# Patient Record
Sex: Male | Born: 1955 | Race: White | Hispanic: No | Marital: Married | Smoking: Never smoker
Health system: Southern US, Community
[De-identification: ages and names within clinical notes are randomized; demographics above are authoritative.]

## PROBLEM LIST (undated history)

## (undated) DIAGNOSIS — E039 Hypothyroidism, unspecified: Secondary | ICD-10-CM

## (undated) DIAGNOSIS — R972 Elevated prostate specific antigen [PSA]: Secondary | ICD-10-CM

## (undated) DIAGNOSIS — E785 Hyperlipidemia, unspecified: Secondary | ICD-10-CM

## (undated) DIAGNOSIS — R748 Abnormal levels of other serum enzymes: Secondary | ICD-10-CM

## (undated) DIAGNOSIS — E559 Vitamin D deficiency, unspecified: Secondary | ICD-10-CM

## (undated) DIAGNOSIS — I10 Essential (primary) hypertension: Secondary | ICD-10-CM

## (undated) HISTORY — DX: Abnormal levels of other serum enzymes: R74.8

## (undated) HISTORY — DX: Hypothyroidism, unspecified: E03.9

## (undated) HISTORY — DX: Hyperlipidemia, unspecified: E78.5

## (undated) HISTORY — DX: Elevated prostate specific antigen (PSA): R97.20

## (undated) HISTORY — DX: Vitamin D deficiency, unspecified: E55.9

---

## 2001-08-06 ENCOUNTER — Encounter: Admission: RE | Admit: 2001-08-06 | Discharge: 2001-08-06 | Payer: Self-pay | Admitting: Internal Medicine

## 2001-08-06 ENCOUNTER — Encounter: Payer: Self-pay | Admitting: Internal Medicine

## 2003-02-16 ENCOUNTER — Encounter: Admission: RE | Admit: 2003-02-16 | Discharge: 2003-02-16 | Payer: Self-pay | Admitting: Internal Medicine

## 2011-02-02 ENCOUNTER — Other Ambulatory Visit: Payer: Self-pay | Admitting: Family Medicine

## 2011-02-02 DIAGNOSIS — E039 Hypothyroidism, unspecified: Secondary | ICD-10-CM

## 2011-02-03 ENCOUNTER — Ambulatory Visit
Admission: RE | Admit: 2011-02-03 | Discharge: 2011-02-03 | Disposition: A | Discharge status: Home / Self Care | Payer: 59 | Source: Ambulatory Visit | Attending: Family Medicine | Admitting: Family Medicine

## 2011-02-03 DIAGNOSIS — E039 Hypothyroidism, unspecified: Secondary | ICD-10-CM

## 2011-06-08 ENCOUNTER — Ambulatory Visit
Admission: RE | Admit: 2011-06-08 | Discharge: 2011-06-08 | Disposition: A | Discharge status: Home / Self Care | Payer: Managed Care, Other (non HMO) | Source: Ambulatory Visit | Attending: Emergency Medicine | Admitting: Emergency Medicine

## 2011-06-08 ENCOUNTER — Other Ambulatory Visit: Payer: Self-pay | Admitting: Emergency Medicine

## 2011-06-08 DIAGNOSIS — M25539 Pain in unspecified wrist: Secondary | ICD-10-CM

## 2011-06-14 ENCOUNTER — Other Ambulatory Visit: Payer: Self-pay | Admitting: Emergency Medicine

## 2011-06-14 DIAGNOSIS — M25532 Pain in left wrist: Secondary | ICD-10-CM

## 2011-06-14 DIAGNOSIS — M25531 Pain in right wrist: Secondary | ICD-10-CM

## 2011-06-16 ENCOUNTER — Ambulatory Visit
Admission: RE | Admit: 2011-06-16 | Discharge: 2011-06-16 | Disposition: A | Discharge status: Home / Self Care | Payer: Managed Care, Other (non HMO) | Source: Ambulatory Visit | Attending: Emergency Medicine | Admitting: Emergency Medicine

## 2011-06-16 DIAGNOSIS — M25531 Pain in right wrist: Secondary | ICD-10-CM

## 2012-03-14 ENCOUNTER — Other Ambulatory Visit: Payer: Self-pay | Admitting: Endocrinology

## 2012-03-14 DIAGNOSIS — E039 Hypothyroidism, unspecified: Secondary | ICD-10-CM

## 2012-03-26 ENCOUNTER — Ambulatory Visit
Admission: RE | Admit: 2012-03-26 | Discharge: 2012-03-26 | Disposition: A | Discharge status: Home / Self Care | Payer: Managed Care, Other (non HMO) | Source: Ambulatory Visit | Attending: Endocrinology | Admitting: Endocrinology

## 2012-03-26 DIAGNOSIS — E039 Hypothyroidism, unspecified: Secondary | ICD-10-CM

## 2014-01-19 ENCOUNTER — Other Ambulatory Visit: Payer: Self-pay

## 2014-01-19 ENCOUNTER — Other Ambulatory Visit: Payer: Self-pay | Admitting: Family Medicine

## 2014-01-19 DIAGNOSIS — R945 Abnormal results of liver function studies: Secondary | ICD-10-CM

## 2014-02-02 ENCOUNTER — Ambulatory Visit
Admission: RE | Admit: 2014-02-02 | Discharge: 2014-02-02 | Disposition: A | Discharge status: Home / Self Care | Payer: Managed Care, Other (non HMO) | Source: Ambulatory Visit | Attending: Family Medicine | Admitting: Family Medicine

## 2014-02-02 DIAGNOSIS — R945 Abnormal results of liver function studies: Secondary | ICD-10-CM

## 2014-08-31 ENCOUNTER — Telehealth: Payer: Self-pay | Admitting: Hematology and Oncology

## 2014-08-31 NOTE — Telephone Encounter (Signed)
NEW PATIENT REFERRAL-S/W AMY @ DR. [facility] OFFICE AND GVE NP APPT FOR 07/28 @ 3:15 W/DR. [place] DX-HEMOCHROMATOSIS

## 2014-09-22 ENCOUNTER — Telehealth: Payer: Self-pay | Admitting: Hematology and Oncology

## 2014-09-22 NOTE — Telephone Encounter (Signed)
pt called to r/s appt...done...pt needed HIM new pt....transferred him to HIM

## 2014-10-01 ENCOUNTER — Ambulatory Visit: Payer: Managed Care, Other (non HMO) | Admitting: Hematology and Oncology

## 2014-10-01 ENCOUNTER — Ambulatory Visit: Payer: Managed Care, Other (non HMO)

## 2015-02-09 ENCOUNTER — Other Ambulatory Visit: Payer: Self-pay | Admitting: Emergency Medicine

## 2015-02-09 DIAGNOSIS — M25531 Pain in right wrist: Secondary | ICD-10-CM

## 2015-02-20 ENCOUNTER — Ambulatory Visit
Admission: RE | Admit: 2015-02-20 | Discharge: 2015-02-20 | Disposition: A | Discharge status: Home / Self Care | Payer: Managed Care, Other (non HMO) | Source: Ambulatory Visit | Attending: Emergency Medicine | Admitting: Emergency Medicine

## 2015-02-20 DIAGNOSIS — M25531 Pain in right wrist: Secondary | ICD-10-CM

## 2015-11-25 ENCOUNTER — Telehealth: Payer: Self-pay | Admitting: Hematology

## 2015-11-25 ENCOUNTER — Encounter: Payer: Self-pay | Admitting: Hematology

## 2015-11-25 NOTE — Telephone Encounter (Signed)
Pt confirmed appt, completed intake, mailed pt letter, faxed referring provider appt date/time

## 2015-12-21 ENCOUNTER — Telehealth: Payer: Self-pay | Admitting: Hematology

## 2015-12-21 ENCOUNTER — Encounter: Payer: Self-pay | Admitting: Hematology

## 2015-12-21 ENCOUNTER — Ambulatory Visit (HOSPITAL_BASED_OUTPATIENT_CLINIC_OR_DEPARTMENT_OTHER): Payer: Managed Care, Other (non HMO) | Admitting: Hematology

## 2015-12-21 ENCOUNTER — Other Ambulatory Visit (HOSPITAL_BASED_OUTPATIENT_CLINIC_OR_DEPARTMENT_OTHER): Payer: Managed Care, Other (non HMO)

## 2015-12-21 ENCOUNTER — Other Ambulatory Visit: Payer: Self-pay | Admitting: *Deleted

## 2015-12-21 DIAGNOSIS — E785 Hyperlipidemia, unspecified: Secondary | ICD-10-CM | POA: Insufficient documentation

## 2015-12-21 DIAGNOSIS — R972 Elevated prostate specific antigen [PSA]: Secondary | ICD-10-CM | POA: Insufficient documentation

## 2015-12-21 DIAGNOSIS — K7689 Other specified diseases of liver: Secondary | ICD-10-CM | POA: Diagnosis not present

## 2015-12-21 DIAGNOSIS — M545 Low back pain, unspecified: Secondary | ICD-10-CM | POA: Insufficient documentation

## 2015-12-21 DIAGNOSIS — M109 Gout, unspecified: Secondary | ICD-10-CM | POA: Insufficient documentation

## 2015-12-21 DIAGNOSIS — G4733 Obstructive sleep apnea (adult) (pediatric): Secondary | ICD-10-CM | POA: Insufficient documentation

## 2015-12-21 DIAGNOSIS — R748 Abnormal levels of other serum enzymes: Secondary | ICD-10-CM | POA: Insufficient documentation

## 2015-12-21 DIAGNOSIS — R945 Abnormal results of liver function studies: Secondary | ICD-10-CM

## 2015-12-21 DIAGNOSIS — B359 Dermatophytosis, unspecified: Secondary | ICD-10-CM | POA: Insufficient documentation

## 2015-12-21 DIAGNOSIS — E039 Hypothyroidism, unspecified: Secondary | ICD-10-CM | POA: Diagnosis not present

## 2015-12-21 LAB — COMPREHENSIVE METABOLIC PANEL
ALT: 98 U/L — ABNORMAL HIGH (ref 0–55)
AST: 66 U/L — ABNORMAL HIGH (ref 5–34)
Albumin: 3.8 g/dL (ref 3.5–5.0)
Alkaline Phosphatase: 76 U/L (ref 40–150)
Anion Gap: 9 mEq/L (ref 3–11)
BUN: 14.5 mg/dL (ref 7.0–26.0)
CO2: 23 mEq/L (ref 22–29)
Calcium: 9.2 mg/dL (ref 8.4–10.4)
Chloride: 109 mEq/L (ref 98–109)
Creatinine: 1 mg/dL (ref 0.7–1.3)
EGFR: 85 mL/min/{1.73_m2} — ABNORMAL LOW (ref >90)
Glucose: 92 mg/dl (ref 70–140)
Potassium: 3.9 mEq/L (ref 3.5–5.1)
Sodium: 141 mEq/L (ref 136–145)
Total Bilirubin: 0.73 mg/dL (ref 0.20–1.20)
Total Protein: 7.2 g/dL (ref 6.4–8.3)

## 2015-12-21 LAB — CBC & DIFF AND RETIC
BASO%: 0.7 % (ref 0.0–2.0)
Basophils Absolute: 0 10*3/uL (ref 0.0–0.1)
EOS%: 3.1 % (ref 0.0–7.0)
Eosinophils Absolute: 0.2 10*3/uL (ref 0.0–0.5)
HCT: 45.8 % (ref 38.4–49.9)
HGB: 16.4 g/dL (ref 13.0–17.1)
Immature Retic Fract: 6.7 % (ref 3.00–10.60)
LYMPH%: 40.4 % (ref 14.0–49.0)
MCH: 31.4 pg (ref 27.2–33.4)
MCHC: 35.8 g/dL (ref 32.0–36.0)
MCV: 87.7 fL (ref 79.3–98.0)
MONO#: 0.5 10*3/uL (ref 0.1–0.9)
MONO%: 9.3 % (ref 0.0–14.0)
NEUT#: 2.7 10*3/uL (ref 1.5–6.5)
NEUT%: 46.5 % (ref 39.0–75.0)
Platelets: 160 10*3/uL (ref 140–400)
RBC: 5.22 10*6/uL (ref 4.20–5.82)
RDW: 13.1 % (ref 11.0–14.6)
Retic %: 1.29 % (ref 0.80–1.80)
Retic Ct Abs: 67.34 10*3/uL (ref 34.80–93.90)
WBC: 5.8 10*3/uL (ref 4.0–10.3)
lymph#: 2.3 10*3/uL (ref 0.9–3.3)

## 2015-12-21 LAB — IRON AND TIBC
%SAT: 34 % (ref 20–55)
Iron: 100 ug/dL (ref 42–163)
TIBC: 295 ug/dL (ref 202–409)
UIBC: 195 ug/dL (ref 117–376)

## 2015-12-21 LAB — FERRITIN: Ferritin: 710 ng/ml — ABNORMAL HIGH (ref 22–316)

## 2015-12-21 NOTE — Telephone Encounter (Signed)
GAVE PATIENT AVE REPORT AND APPOINTMENTS FOR October THRU December

## 2015-12-21 NOTE — Progress Notes (Signed)
[name]    HEMATOLOGY/ONCOLOGY CONSULTATION NOTE  Date of Service: 12/21/2015  PCP: Dr Aura Dials MD   CHIEF COMPLAINTS/PURPOSE OF CONSULTATION:  Elevated ferritin with concern for hemachromatosis  HISTORY OF PRESENTING ILLNESS:   Bob Burton is a wonderful 60 y.o. male who has been referred to [place] by Dr Aura Dials MD for evaluation and management of elevated ferritin with concern for hemochromatosis.  Patient has a history of hypertension, sleep apnea, hypothyroidism, gout who on routine labs with his primary care physician was noted to have was noted to have abnormal liver function tests in December 2015. At the time his AST was 73 and ALT was 124 with a normal bilirubin and alkaline phosphatase. With an ultrasound of the abdomen done on 02/02/2014 showing diffuse and markedly increased echogenicity of the liver or definition of the liver architecture suggestive of severe fatty infiltration of the liver. No focal hepatic lesions or intrahepatic biliary dilatation was noted. Spleen was noted to be normal in size and unremarkable. Workup at the time also showed that his ferritin level was 791.2 with an iron saturation of 41%.  He has been on treatment for his dyslipidemia. He notes that he is being followed by a liver specialist for management of his abnormal liver functions. He reports that he donated PRBCs every 2 months for 15 years until he moved to the [place] in 1997. After this he could not been a blood concerns with Mad cow disease in [place].  Patient notes no abdominal pain or distention. Does not report a clear family history of hemochromatosis or iron overload disorders or early heart disease or liver cirrhosis. His father however was disease due to suicide and it is unclear if he had any significant health problems.  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 Now uses an oral device. 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  #8 elevated liver function tests  #9 history of gout #10 gastroesophageal reflux  SURGICAL HISTORY:  #1 right wrist surgery #2 reports he is scheduled for carpal tunnel syndrome surgery #3 Lasik 2007  SOCIAL HISTORY: Social History   Social History  . Marital status: Married    Spouse name: N/A  . Number of children: N/A  . Years of education: N/A   Occupational History  . Not on file.   Social History Main Topics  . Smoking status: Not on file  . Smokeless tobacco: Not on file  . Alcohol use Not on file  . Drug use: Unknown  . Sexual activity: Not on file   Other Topics Concern  . Not on file   Social History Narrative  . 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 He would from [place] to the [place] in 1997.  FAMILY HISTORY: Patient is originally from [place]  Father deceased-from suicide Mother 42 year old  ALLERGIES:  has No Known Allergies.  MEDICATIONS:  Current Outpatient Prescriptions  Medication Sig Dispense Refill  . levothyroxine (SYNTHROID, LEVOTHROID) 150 MCG tablet Take 150 mcg by mouth daily before breakfast.     . Omega-3  Fatty Acids (FISH OIL PO) Take 1 capsule by mouth every morning.     . Ascorbic Acid (VITAMIN C PO) Take 1 tablet by mouth daily.    . Cyanocobalamin (VITAMIN B 12 PO) Take 1 tablet by mouth daily.    [name] lisinopril-hydrochlorothiazide (PRINZIDE,ZESTORETIC) 10-12.5 MG tablet Take 1 tablet by mouth every morning.   0   No current facility-administered  medications for this visit.     REVIEW OF SYSTEMS:    10 Point review of Systems was done is negative except as noted above.  PHYSICAL EXAMINATION: ECOG PERFORMANCE STATUS: 0 - Asymptomatic  . Vitals:   12/21/15 1143  BP: (!) 160/96  Pulse: (!) 54  Resp: 18  Temp: 97.9 F (36.6 C)   Filed Weights   12/21/15 1143  Weight: 273 lb 8 oz (124.1 kg)   .There is no height or weight on file to calculate BMI.  GENERAL:alert, in no acute distress and comfortable SKIN: skin color, texture, turgor are normal, no rashes or significant lesions EYES: normal, conjunctiva are pink and non-injected, sclera clear OROPHARYNX:no exudate, no erythema and lips, buccal mucosa, and tongue normal  NECK: supple, no JVD, thyroid normal size, non-tender, without nodularity LYMPH:  no palpable lymphadenopathy in the cervical, axillary or inguinal LUNGS: clear to auscultation with normal respiratory effort HEART: regular rate & rhythm,  no murmurs and no lower extremity edema ABDOMEN: abdomen soft, non-tender, normoactive bowel sounds  Musculoskeletal: no cyanosis of digits and no clubbing  PSYCH: alert & oriented x 3 with fluent speech NEURO: no focal motor/sensory deficits  LABORATORY DATA:  I have reviewed the data as listed  Component     Latest Ref Rng & Units 12/21/2015  WBC     4.0 - 10.3 10e3/uL 5.8  NEUT#     1.5 - 6.5 10e3/uL 2.7  Hemoglobin     13.0 - 17.1 g/dL 16.4  HCT     38.4 - 49.9 % 45.8  Platelets     140 - 400 10e3/uL 160  MCV     79.3 - 98.0 fL 87.7  MCH     27.2 - 33.4 pg 31.4  MCHC     32.0 - 36.0 g/dL 35.8  RBC     4.20 - 5.82 10e6/uL 5.22  RDW     11.0 - 14.6 % 13.1  lymph#     0.9 - 3.3 10e3/uL 2.3  MONO#     0.1 - 0.9 10e3/uL 0.5  Eosinophils Absolute     0.0 - 0.5 10e3/uL 0.2  Basophils Absolute     0.0 - 0.1 10e3/uL 0.0  NEUT%     39.0 - 75.0 % 46.5  LYMPH%     14.0 - 49.0 % 40.4  MONO%     0.0 - 14.0 % 9.3  EOS%     0.0 - 7.0 % 3.1  BASO%      0.0 - 2.0 % 0.7  Retic %     0.80 - 1.80 % 1.29  Retic Ct Abs     34.80 - 93.90 10e3/uL 67.34  Immature Retic Fract     3.00 - 10.60 % 6.70  Iron     42 - 163 ug/dL 100  TIBC     202 - 409 ug/dL 295  UIBC     117 - 376 ug/dL 195  %SAT     20 - 55 % 34  Ferritin     22 - 316 ng/ml 710 (H)  RADIOGRAPHIC STUDIES: I have personally reviewed the radiological images as listed and agreed with the findings in the report. No results found.  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 Patient's ferritin at baseline appears to be around 700's As noted about he was a blood donor for 15 years prior to 1997 which was probably somewhat protective. Most patients with isolated H63D carrier state did not develop overt clinical presentations of hemochromatosis. Patient does not report any overt joint issues. Has had no cardiac issues or cardiac arrhythmias. Has had elevated liver function tests attributable both to steatohepatitis.  #2 Elevated liver function tests This is likely due to fatty liver which was noted to be quite severe on ultrasound in November 2015. Cannot rule out some contribution from his elevated ferritin though this is less likely. Patient has a liver specialist that he is seen (Dawn Drazek NP)  PLAN -The clinical findings and his lab results were discussed in details with the patient. -We discussed that his carrier state usually does not produce the full manifestations of hemachromatosis syndrome and that end organ injury including liver cirrhosis and cardiac issues are usually not seen on this ferritin levels exceed 1000. -However given his abnormal liver function tests we will offer him therapeutic phlebotomies every 2 weeks to try to bring his ferritin level below 100. He might not need highaggressive reduction of ferritin to a goal of less than 50.  -She should continue  follow-up with his liver team for continued evaluation of his abnormal liver functions. If there is uncertainty regarding the actual etiology of his liver function abnormality his liver specialists might consider a liver biopsy to prove presence of steatohepatitis versus Iron overload versus other etiology. -He will likely need ultrasound abdomen and AFP tumor markers every 6 months with his primary care physician or liver team. -We will set him up for every 2 week therapeutic phlebotomies 2 months with every 2 week CBC and every 4 week ferritin analysis.  #3 hypertension, dyslipidemia, hypothyroidism, sleep apnea, GERD -Continue management as per primary care physician  Labs today and every 2 weeks with phlebotomy Therapeutic phlebotomy q2weeks RTC with Dr Irene Limbo in 2 months  I appreciate the opportunity to consult on this wonderful person. Kindly let me know if any other questions or concerns arise. All of the patients questions were answered with apparent satisfaction. The patient knows to call the clinic with any problems, questions or concerns.  I spent 50 minutes counseling the patient face to face. The total time spent in the appointment was 60 minutes and more than 50% was on counseling and direct patient cares.    Sullivan Lone MD [place] AAHIVMS [facility] Hematology/Oncology Physician [facility]  (Office):       [phone] (Work cell):  [phone] (Fax):           [phone]  12/21/2015 11:42 AM

## 2015-12-21 NOTE — Patient Instructions (Signed)
-  Labs will be done today -we will Set you up for therapeutic phlebotomy every 2 weeks for 2 months and see her back in clinic in 2 months for reassessment. -Avoid oral iron pills or multivitamins with iron or vit C -Avoid cooking in cast iron skillets -Cut down red meat ingestion as possible. -Avoid eating raw seafood due to increased risk of vibrio infections. Because about Bob Burton

## 2015-12-28 LAB — HEMOCHROMATOSIS DNA-PCR(C282Y,H63D)

## 2016-01-04 ENCOUNTER — Other Ambulatory Visit (HOSPITAL_BASED_OUTPATIENT_CLINIC_OR_DEPARTMENT_OTHER): Payer: Managed Care, Other (non HMO)

## 2016-01-04 ENCOUNTER — Ambulatory Visit (HOSPITAL_BASED_OUTPATIENT_CLINIC_OR_DEPARTMENT_OTHER): Payer: Managed Care, Other (non HMO)

## 2016-01-04 DIAGNOSIS — R945 Abnormal results of liver function studies: Secondary | ICD-10-CM

## 2016-01-04 LAB — CBC & DIFF AND RETIC
BASO%: 0.3 % (ref 0.0–2.0)
Basophils Absolute: 0 10*3/uL (ref 0.0–0.1)
EOS%: 2.4 % (ref 0.0–7.0)
Eosinophils Absolute: 0.2 10*3/uL (ref 0.0–0.5)
HCT: 47.8 % (ref 38.4–49.9)
HGB: 17.2 g/dL — ABNORMAL HIGH (ref 13.0–17.1)
Immature Retic Fract: 4.1 % (ref 3.00–10.60)
LYMPH%: 25.1 % (ref 14.0–49.0)
MCH: 31.4 pg (ref 27.2–33.4)
MCHC: 36 g/dL (ref 32.0–36.0)
MCV: 87.4 fL (ref 79.3–98.0)
MONO#: 0.7 10*3/uL (ref 0.1–0.9)
MONO%: 7.8 % (ref 0.0–14.0)
NEUT#: 5.9 10*3/uL (ref 1.5–6.5)
NEUT%: 64.4 % (ref 39.0–75.0)
Platelets: 162 10*3/uL (ref 140–400)
RBC: 5.47 10*6/uL (ref 4.20–5.82)
RDW: 13.2 % (ref 11.0–14.6)
Retic %: 1.38 % (ref 0.80–1.80)
Retic Ct Abs: 75.49 10*3/uL (ref 34.80–93.90)
WBC: 9.2 10*3/uL (ref 4.0–10.3)
lymph#: 2.3 10*3/uL (ref 0.9–3.3)

## 2016-01-04 LAB — COMPREHENSIVE METABOLIC PANEL
ALT: 84 U/L — ABNORMAL HIGH (ref 0–55)
AST: 45 U/L — ABNORMAL HIGH (ref 5–34)
Albumin: 3.8 g/dL (ref 3.5–5.0)
Alkaline Phosphatase: 83 U/L (ref 40–150)
Anion Gap: 10 mEq/L (ref 3–11)
BUN: 17.2 mg/dL (ref 7.0–26.0)
CO2: 22 mEq/L (ref 22–29)
Calcium: 9.5 mg/dL (ref 8.4–10.4)
Chloride: 108 mEq/L (ref 98–109)
Creatinine: 1 mg/dL (ref 0.7–1.3)
EGFR: 82 mL/min/{1.73_m2} — ABNORMAL LOW (ref >90)
Glucose: 99 mg/dl (ref 70–140)
Potassium: 3.8 mEq/L (ref 3.5–5.1)
Sodium: 140 mEq/L (ref 136–145)
Total Bilirubin: 0.8 mg/dL (ref 0.20–1.20)
Total Protein: 7.7 g/dL (ref 6.4–8.3)

## 2016-01-04 LAB — FERRITIN: Ferritin: 510 ng/ml — ABNORMAL HIGH (ref 22–316)

## 2016-01-04 NOTE — Progress Notes (Signed)
Patient arrived for 1st Phlebotomy.  Patient was familiar with giving blood so I explained that its the same concept.  Used 16g phlebotomy kit in left ac, started at 1409 finished at 1416 removed a total of 529 ml.  Patient accepted nourishment and agreed to remain for vs at 30 post mark.

## 2016-01-04 NOTE — Patient Instructions (Signed)

## 2016-01-18 ENCOUNTER — Other Ambulatory Visit (HOSPITAL_BASED_OUTPATIENT_CLINIC_OR_DEPARTMENT_OTHER): Payer: Managed Care, Other (non HMO)

## 2016-01-18 ENCOUNTER — Ambulatory Visit (HOSPITAL_BASED_OUTPATIENT_CLINIC_OR_DEPARTMENT_OTHER): Payer: Managed Care, Other (non HMO)

## 2016-01-18 DIAGNOSIS — R945 Abnormal results of liver function studies: Secondary | ICD-10-CM

## 2016-01-18 LAB — CBC & DIFF AND RETIC
BASO%: 1.1 % (ref 0.0–2.0)
Basophils Absolute: 0.1 10*3/uL (ref 0.0–0.1)
EOS%: 4.2 % (ref 0.0–7.0)
Eosinophils Absolute: 0.3 10*3/uL (ref 0.0–0.5)
HCT: 47.8 % (ref 38.4–49.9)
HGB: 16.9 g/dL (ref 13.0–17.1)
Immature Retic Fract: 5.8 % (ref 3.00–10.60)
LYMPH%: 43.6 % (ref 14.0–49.0)
MCH: 31.3 pg (ref 27.2–33.4)
MCHC: 35.4 g/dL (ref 32.0–36.0)
MCV: 88.5 fL (ref 79.3–98.0)
MONO#: 0.7 10*3/uL (ref 0.1–0.9)
MONO%: 9.9 % (ref 0.0–14.0)
NEUT#: 3 10*3/uL (ref 1.5–6.5)
NEUT%: 41.2 % (ref 39.0–75.0)
Platelets: 179 10*3/uL (ref 140–400)
RBC: 5.4 10*6/uL (ref 4.20–5.82)
RDW: 13.4 % (ref 11.0–14.6)
Retic %: 1.61 % (ref 0.80–1.80)
Retic Ct Abs: 86.94 10*3/uL (ref 34.80–93.90)
WBC: 7.3 10*3/uL (ref 4.0–10.3)
lymph#: 3.2 10*3/uL (ref 0.9–3.3)

## 2016-01-18 NOTE — Patient Instructions (Signed)
Therapeutic Phlebotomy Therapeutic phlebotomy is the controlled removal of blood from a person's body for the purpose of treating a medical condition. The procedure is similar to donating blood. Usually, about a pint (470 mL, or 0.47L) of blood is removed. The average adult has 9-12 pints (4.3-5.7 L) of blood. Therapeutic phlebotomy may be used to treat the following medical conditions:  Hemochromatosis. This is a condition in which the blood contains too much iron.  Polycythemia vera. This is a condition in which the blood contains too many red blood cells.  Porphyria cutanea tarda. This is a disease in which an important part of hemoglobin is not made properly. It results in the buildup of abnormal amounts of porphyrins in the body.  Sickle cell disease. This is a condition in which the red blood cells form an abnormal crescent shape rather than a round shape. Tell a health care provider about:  Any allergies you have.  All medicines you are taking, including vitamins, herbs, eye drops, creams, and over-the-counter medicines.  Any problems you or family members have had with anesthetic medicines.  Any blood disorders you have.  Any surgeries you have had.  Any medical conditions you have. What are the risks? Generally, this is a safe procedure. However, problems may occur, including:  Nausea or light-headedness.  Low blood pressure.  Soreness, bleeding, swelling, or bruising at the needle insertion site.  Infection. What happens before the procedure?  Follow instructions from your health care provider about eating or drinking restrictions.  Ask your health care provider about changing or stopping your regular medicines. This is especially important if you are taking diabetes medicines or blood thinners.  Wear clothing with sleeves that can be raised above the elbow.  Plan to have someone take you home after the procedure.  You may have a blood sample taken. What happens  during the procedure?  A needle will be inserted into one of your veins.  Tubing and a collection bag will be attached to that needle.  Blood will flow through the needle and tubing into the collection bag.  You may be asked to open and close your hand slowly and continually during the entire collection.  After the specified amount of blood has been removed from your body, the collection bag and tubing will be clamped.  The needle will be removed from your vein.  Pressure will be held on the site of the needle insertion to stop the bleeding.  A bandage (dressing) will be placed over the needle insertion site. The procedure may vary among health care providers and hospitals. What happens after the procedure?  Your recovery will be assessed and monitored.  You can return to your normal activities as directed by your health care provider. This information is not intended to replace advice given to you by your health care provider. Make sure you discuss any questions you have with your health care provider. Document Released: 07/25/2010 Document Revised: 10/23/2015 Document Reviewed: 02/16/2014 Elsevier Interactive Patient Education  2017 Elsevier Inc.  

## 2016-01-26 ENCOUNTER — Other Ambulatory Visit (HOSPITAL_COMMUNITY): Payer: Self-pay | Admitting: Nurse Practitioner

## 2016-01-26 DIAGNOSIS — R748 Abnormal levels of other serum enzymes: Secondary | ICD-10-CM

## 2016-01-26 DIAGNOSIS — K760 Fatty (change of) liver, not elsewhere classified: Secondary | ICD-10-CM

## 2016-01-31 ENCOUNTER — Other Ambulatory Visit: Payer: Self-pay | Admitting: Radiology

## 2016-02-01 ENCOUNTER — Ambulatory Visit (HOSPITAL_BASED_OUTPATIENT_CLINIC_OR_DEPARTMENT_OTHER): Payer: Managed Care, Other (non HMO)

## 2016-02-01 ENCOUNTER — Other Ambulatory Visit (HOSPITAL_BASED_OUTPATIENT_CLINIC_OR_DEPARTMENT_OTHER): Payer: Managed Care, Other (non HMO)

## 2016-02-01 DIAGNOSIS — R945 Abnormal results of liver function studies: Secondary | ICD-10-CM

## 2016-02-01 LAB — COMPREHENSIVE METABOLIC PANEL
ALBUMIN: 3.7 g/dL (ref 3.5–5.0)
ALK PHOS: 82 U/L (ref 40–150)
ALT: 102 U/L — ABNORMAL HIGH (ref 0–55)
ANION GAP: 8 meq/L (ref 3–11)
AST: 60 U/L — ABNORMAL HIGH (ref 5–34)
BILIRUBIN TOTAL: 0.47 mg/dL (ref 0.20–1.20)
BUN: 24.2 mg/dL (ref 7.0–26.0)
CALCIUM: 9.7 mg/dL (ref 8.4–10.4)
CO2: 22 mEq/L (ref 22–29)
Chloride: 111 mEq/L — ABNORMAL HIGH (ref 98–109)
Creatinine: 1 mg/dL (ref 0.7–1.3)
EGFR: 84 mL/min/{1.73_m2} — AB (ref >90)
GLUCOSE: 135 mg/dL (ref 70–140)
POTASSIUM: 3.6 meq/L (ref 3.5–5.1)
SODIUM: 141 meq/L (ref 136–145)
Total Protein: 7.3 g/dL (ref 6.4–8.3)

## 2016-02-01 LAB — CBC & DIFF AND RETIC
BASO%: 0.8 % (ref 0.0–2.0)
Basophils Absolute: 0.1 10*3/uL (ref 0.0–0.1)
EOS ABS: 0.5 10*3/uL (ref 0.0–0.5)
EOS%: 6 % (ref 0.0–7.0)
HCT: 44.7 % (ref 38.4–49.9)
HEMOGLOBIN: 15.8 g/dL (ref 13.0–17.1)
Immature Retic Fract: 6.5 % (ref 3.00–10.60)
LYMPH%: 36.6 % (ref 14.0–49.0)
MCH: 31.3 pg (ref 27.2–33.4)
MCHC: 35.3 g/dL (ref 32.0–36.0)
MCV: 88.5 fL (ref 79.3–98.0)
MONO#: 0.7 10*3/uL (ref 0.1–0.9)
MONO%: 9.1 % (ref 0.0–14.0)
NEUT%: 47.5 % (ref 39.0–75.0)
NEUTROS ABS: 3.6 10*3/uL (ref 1.5–6.5)
PLATELETS: 193 10*3/uL (ref 140–400)
RBC: 5.05 10*6/uL (ref 4.20–5.82)
RDW: 13.9 % (ref 11.0–14.6)
Retic %: 1.78 % (ref 0.80–1.80)
Retic Ct Abs: 89.89 10*3/uL (ref 34.80–93.90)
WBC: 7.5 10*3/uL (ref 4.0–10.3)
lymph#: 2.8 10*3/uL (ref 0.9–3.3)

## 2016-02-01 LAB — FERRITIN: FERRITIN: 463 ng/mL — AB (ref 22–316)

## 2016-02-01 NOTE — Patient Instructions (Signed)
Therapeutic Phlebotomy Therapeutic phlebotomy is the controlled removal of blood from a person's body for the purpose of treating a medical condition. The procedure is similar to donating blood. Usually, about a pint (470 mL, or 0.47L) of blood is removed. The average adult has 9-12 pints (4.3-5.7 L) of blood. Therapeutic phlebotomy may be used to treat the following medical conditions:  Hemochromatosis. This is a condition in which the blood contains too much iron.  Polycythemia vera. This is a condition in which the blood contains too many red blood cells.  Porphyria cutanea tarda. This is a disease in which an important part of hemoglobin is not made properly. It results in the buildup of abnormal amounts of porphyrins in the body.  Sickle cell disease. This is a condition in which the red blood cells form an abnormal crescent shape rather than a round shape. Tell a health care provider about:  Any allergies you have.  All medicines you are taking, including vitamins, herbs, eye drops, creams, and over-the-counter medicines.  Any problems you or family members have had with anesthetic medicines.  Any blood disorders you have.  Any surgeries you have had.  Any medical conditions you have. What are the risks? Generally, this is a safe procedure. However, problems may occur, including:  Nausea or light-headedness.  Low blood pressure.  Soreness, bleeding, swelling, or bruising at the needle insertion site.  Infection. What happens before the procedure?  Follow instructions from your health care provider about eating or drinking restrictions.  Ask your health care provider about changing or stopping your regular medicines. This is especially important if you are taking diabetes medicines or blood thinners.  Wear clothing with sleeves that can be raised above the elbow.  Plan to have someone take you home after the procedure.  You may have a blood sample taken. What happens  during the procedure?  A needle will be inserted into one of your veins.  Tubing and a collection bag will be attached to that needle.  Blood will flow through the needle and tubing into the collection bag.  You may be asked to open and close your hand slowly and continually during the entire collection.  After the specified amount of blood has been removed from your body, the collection bag and tubing will be clamped.  The needle will be removed from your vein.  Pressure will be held on the site of the needle insertion to stop the bleeding.  A bandage (dressing) will be placed over the needle insertion site. The procedure may vary among health care providers and hospitals. What happens after the procedure?  Your recovery will be assessed and monitored.  You can return to your normal activities as directed by your health care provider. This information is not intended to replace advice given to you by your health care provider. Make sure you discuss any questions you have with your health care provider. Document Released: 07/25/2010 Document Revised: 10/23/2015 Document Reviewed: 02/16/2014 Elsevier Interactive Patient Education  2017 Elsevier Inc.  

## 2016-02-01 NOTE — Progress Notes (Signed)
Bob Burton presents today for phlebotomy per MD orders. Phlebotomy procedure started at 1324 and ended at 1331 via 16 gauge needle to R AC. 540 grams removed. Pt given post procedure hydration and snacks. Patient observed for 30 minutes after procedure without any incident. Patient tolerated procedure well. IV needle removed intact.

## 2016-02-02 ENCOUNTER — Ambulatory Visit (HOSPITAL_COMMUNITY)
Admission: RE | Admit: 2016-02-02 | Discharge: 2016-02-02 | Disposition: A | Discharge status: Home / Self Care | Payer: Managed Care, Other (non HMO) | Source: Ambulatory Visit | Attending: Nurse Practitioner | Admitting: Nurse Practitioner

## 2016-02-02 DIAGNOSIS — R748 Abnormal levels of other serum enzymes: Secondary | ICD-10-CM

## 2016-02-02 DIAGNOSIS — K760 Fatty (change of) liver, not elsewhere classified: Secondary | ICD-10-CM | POA: Diagnosis not present

## 2016-02-02 LAB — CBC WITH DIFFERENTIAL/PLATELET
Basophils Absolute: 0.1 10*3/uL (ref 0.0–0.1)
Basophils Relative: 1 %
EOS PCT: 7 %
Eosinophils Absolute: 0.5 10*3/uL (ref 0.0–0.7)
HEMATOCRIT: 41.8 % (ref 39.0–52.0)
HEMOGLOBIN: 14.7 g/dL (ref 13.0–17.0)
LYMPHS ABS: 2.8 10*3/uL (ref 0.7–4.0)
LYMPHS PCT: 40 %
MCH: 31.1 pg (ref 26.0–34.0)
MCHC: 35.2 g/dL (ref 30.0–36.0)
MCV: 88.4 fL (ref 78.0–100.0)
Monocytes Absolute: 0.6 10*3/uL (ref 0.1–1.0)
Monocytes Relative: 8 %
NEUTROS ABS: 3.1 10*3/uL (ref 1.7–7.7)
NEUTROS PCT: 44 %
Platelets: 190 10*3/uL (ref 150–400)
RBC: 4.73 MIL/uL (ref 4.22–5.81)
RDW: 13.6 % (ref 11.5–15.5)
WBC: 7.1 10*3/uL (ref 4.0–10.5)

## 2016-02-02 LAB — COMPREHENSIVE METABOLIC PANEL
ALK PHOS: 55 U/L (ref 38–126)
ALT: 104 U/L — AB (ref 17–63)
AST: 70 U/L — AB (ref 15–41)
Albumin: 3.6 g/dL (ref 3.5–5.0)
Anion gap: 5 (ref 5–15)
BILIRUBIN TOTAL: 0.6 mg/dL (ref 0.3–1.2)
BUN: 18 mg/dL (ref 6–20)
CALCIUM: 9.3 mg/dL (ref 8.9–10.3)
CO2: 23 mmol/L (ref 22–32)
CREATININE: 0.99 mg/dL (ref 0.61–1.24)
Chloride: 113 mmol/L — ABNORMAL HIGH (ref 101–111)
Glucose, Bld: 79 mg/dL (ref 65–99)
Potassium: 3.6 mmol/L (ref 3.5–5.1)
Sodium: 141 mmol/L (ref 135–145)
Total Protein: 6.3 g/dL — ABNORMAL LOW (ref 6.5–8.1)

## 2016-02-02 LAB — PROTIME-INR
INR: 1.11
PROTHROMBIN TIME: 14.3 s (ref 11.4–15.2)

## 2016-02-02 MED ORDER — SODIUM CHLORIDE 0.9 % IV SOLN
INTRAVENOUS | Status: DC
  Administered 2016-02-02: 13:00:00 via INTRAVENOUS

## 2016-02-02 MED ORDER — GELATIN ABSORBABLE 12-7 MM EX MISC
CUTANEOUS | Status: AC
  Filled 2016-02-02: qty 1

## 2016-02-02 MED ORDER — MIDAZOLAM HCL 2 MG/2ML IJ SOLN
INTRAMUSCULAR | Status: AC
  Filled 2016-02-02: qty 4

## 2016-02-02 MED ORDER — FENTANYL CITRATE (PF) 100 MCG/2ML IJ SOLN
INTRAMUSCULAR | Status: AC
  Filled 2016-02-02: qty 4

## 2016-02-02 MED ORDER — FENTANYL CITRATE (PF) 100 MCG/2ML IJ SOLN
INTRAMUSCULAR | Status: DC | PRN
  Administered 2016-02-02: 50 ug via INTRAVENOUS
  Administered 2016-02-02: 25 ug via INTRAVENOUS
  Administered 2016-02-02: 50 ug via INTRAVENOUS

## 2016-02-02 MED ORDER — LIDOCAINE HCL 1 % IJ SOLN
INTRAMUSCULAR | Status: AC
  Filled 2016-02-02: qty 20

## 2016-02-02 MED ORDER — MIDAZOLAM HCL 2 MG/2ML IJ SOLN
INTRAMUSCULAR | Status: DC | PRN
  Administered 2016-02-02 (×3): 1 mg via INTRAVENOUS

## 2016-02-02 NOTE — Sedation Documentation (Signed)
Patient is resting comfortably. 

## 2016-02-02 NOTE — Discharge Instructions (Signed)
Liver Biopsy, Care After °Introduction °These instructions give you information on caring for yourself after your procedure. Your doctor may also give you more specific instructions. Call your doctor if you have any problems or questions after your procedure. °Follow these instructions at home: °· Rest at home for 1-2 days or as told by your doctor. °· Have someone stay with you for at least 24 hours. °· Do not do these things in the first 24 hours: °¨ Drive. °¨ Use machinery. °¨ Take care of other people. °¨ Sign legal documents. °¨ Take a bath or shower. °· There are many different ways to close and cover a cut (incision). For example, a cut can be closed with stitches, skin glue, or adhesive strips. Follow your doctor's instructions on: °¨ Taking care of your cut. °¨ Changing and removing your bandage (dressing). °¨ Removing whatever was used to close your cut. °· Do not drink alcohol in the first week. °· Do not lift more than 5 pounds or play contact sports for the first 2 weeks. °· Take medicines only as told by your doctor. For 1 week, do not take medicine that has aspirin in it or medicines like ibuprofen. °· Get your test results. °Contact a doctor if: °· A cut bleeds and leaves more than just a small spot of blood. °· A cut is red, puffs up (swells), or hurts more than before. °· Fluid or something else comes from a cut. °· A cut smells bad. °· You have a fever or chills. °Get help right away if: °· You have swelling, bloating, or pain in your belly (abdomen). °· You get dizzy or faint. °· You have a rash. °· You feel sick to your stomach (nauseous) or throw up (vomit). °· You have trouble breathing, feel short of breath, or feel faint. °· Your chest hurts. °· You have problems talking or seeing. °· You have trouble balancing or moving your arms or legs. °This information is not intended to replace advice given to you by your health care provider. Make sure you discuss any questions you have with your  health care provider. °Document Released: 11/30/2007 Document Revised: 07/29/2015 Document Reviewed: 04/18/2013 °© 2017 Elsevier ° °

## 2016-02-02 NOTE — Sedation Documentation (Signed)
O2 d/c's

## 2016-02-02 NOTE — Procedures (Signed)
Interventional Radiology Procedure Note  Procedure: US guided random liver biopsy   Complications: None  Estimated Blood Loss: 0  Recommendations: - Bedrest x 3 hrs - DC home  Signed,  Criselda Peaches, MD

## 2016-02-02 NOTE — Consult Note (Signed)
Chief Complaint: Patient was seen in consultation today for US guided random liver biopsy  Referring Physician(s): Drazek,Dawn  Supervising Physician: Malachy MoanMcCullough, Heath  [facility]  History of Present Illness: Bob Burton is a 60 y.o. male with history of elevated LFT's, hemochromatosis, and fatty liver who presents today for US guided random liver biopsy for further evaluation.   Past Medical History:  Diagnosis Date  . Abnormal liver enzymes   . Dyslipidemia   . Elevated PSA   . Hyperlipemia   . Hypothyroidism   . Vitamin D deficiency     No past surgical history on file.  Allergies: Patient has no known allergies.  Medications: Prior to Admission medications   Medication Sig Start Date End Date Taking? Authorizing Provider  Ascorbic Acid (VITAMIN C PO) Take 1 tablet by mouth daily.   Yes Historical Provider, MD  Cyanocobalamin (VITAMIN B 12 PO) Take 1 tablet by mouth daily.   Yes Historical Provider, MD  levothyroxine (SYNTHROID, LEVOTHROID) 150 MCG tablet Take 150 mcg by mouth daily before breakfast.    Yes Historical Provider, MD  lisinopril-hydrochlorothiazide (PRINZIDE,ZESTORETIC) 10-12.5 MG tablet Take 1 tablet by mouth every morning.  11/04/15  Yes Historical Provider, MD  Omega-3 Fatty Acids (FISH OIL PO) Take 1 capsule by mouth every morning.    Yes Historical Provider, MD     No family history on file.  Social History   Social History  . Marital status: Married    Spouse name: N/A  . Number of children: N/A  . Years of education: N/A   Social History Main Topics  . Smoking status: Not on file  . Smokeless tobacco: Not on file  . Alcohol use Not on file  . Drug use: Unknown  . Sexual activity: Not on file   Other Topics Concern  . Not on file   Social History Narrative  . No narrative on file      Review of Systems pt denies fever, CP, dyspnea, cough, abd pain,N/V or bleeding; he does have OSA, intermittent  back pain  Vital Signs: BP 129/88   Pulse (!) 103   Temp 98 F (36.7 C)   Resp 18   Ht 5\' 11"  (1.803 m)   Wt 265 lb (120.2 kg)   SpO2 98%   BMI 36.96 kg/m   Physical Exam awake/alert; chest- CTA bilat; heart- RRR; abd- obese,soft,+BS,NT; ext- no edema  Mallampati Score:     Imaging: No results found.  Labs:  CBC:  Recent Labs  12/21/15 1338 01/04/16 1228 01/18/16 1207 02/01/16 1208  WBC 5.8 9.2 7.3 7.5  HGB 16.4 17.2* 16.9 15.8  HCT 45.8 47.8 47.8 44.7  PLT 160 162 179 193    COAGS: No results for input(s): INR, APTT in the last 8760 hours.  BMP:  Recent Labs  12/21/15 1338 01/04/16 1228 02/01/16 1208  NA 141 140 141  K 3.9 3.8 3.6  CO2 23 22 22   GLUCOSE 92 99 135  BUN 14.5 17.2 24.2  CALCIUM 9.2 9.5 9.7  CREATININE 1.0 1.0 1.0    LIVER FUNCTION TESTS:  Recent Labs  12/21/15 1338 01/04/16 1228 02/01/16 1208  BILITOT 0.73 0.80 0.47  AST 66* 45* 60*  ALT 98* 84* 102*  ALKPHOS 76 83 82  PROT 7.2 7.7 7.3  ALBUMIN 3.8 3.8 3.7    TUMOR MARKERS: No results for input(s): AFPTM, CEA, CA199, CHROMGRNA in the last 8760 hours.  Assessment and Plan: 60  y.o. male with history of elevated LFT's, hemochromatosis, and fatty liver who presents today for US guided random liver biopsy for further evaluation.Risks and benefits discussed with the patient /wife including, but not limited to bleeding, infection, damage to adjacent structures or low yield requiring additional tests. All of the patient's questions were answered, patient is agreeable to proceed. Consent signed and in chart.       Thank you for this interesting consult.  I greatly enjoyed meeting Bob EaglesJukka-Petri E Ferreri and look forward to participating in their care.  A copy of this report was sent to the requesting provider on this date.  Electronically Signed: D. Jeananne RamaKevin Cresta Riden 02/02/2016, 12:22 PM   I spent a total of 20 minutes  in face to face in clinical consultation, greater than 50%  of which was counseling/coordinating care for US guided random liver biopsy

## 2016-02-15 ENCOUNTER — Other Ambulatory Visit: Payer: Self-pay | Admitting: *Deleted

## 2016-02-15 ENCOUNTER — Encounter: Payer: Self-pay | Admitting: Hematology

## 2016-02-15 ENCOUNTER — Telehealth: Payer: Self-pay | Admitting: Hematology

## 2016-02-15 ENCOUNTER — Ambulatory Visit (HOSPITAL_BASED_OUTPATIENT_CLINIC_OR_DEPARTMENT_OTHER): Payer: Managed Care, Other (non HMO)

## 2016-02-15 ENCOUNTER — Other Ambulatory Visit (HOSPITAL_BASED_OUTPATIENT_CLINIC_OR_DEPARTMENT_OTHER): Payer: Managed Care, Other (non HMO)

## 2016-02-15 ENCOUNTER — Ambulatory Visit (HOSPITAL_BASED_OUTPATIENT_CLINIC_OR_DEPARTMENT_OTHER): Payer: Managed Care, Other (non HMO) | Admitting: Hematology

## 2016-02-15 DIAGNOSIS — E039 Hypothyroidism, unspecified: Secondary | ICD-10-CM

## 2016-02-15 DIAGNOSIS — R945 Abnormal results of liver function studies: Secondary | ICD-10-CM

## 2016-02-15 DIAGNOSIS — R7989 Other specified abnormal findings of blood chemistry: Secondary | ICD-10-CM | POA: Diagnosis not present

## 2016-02-15 DIAGNOSIS — I10 Essential (primary) hypertension: Secondary | ICD-10-CM

## 2016-02-15 LAB — CBC & DIFF AND RETIC
BASO%: 0.6 % (ref 0.0–2.0)
BASOS ABS: 0 10*3/uL (ref 0.0–0.1)
EOS%: 5.1 % (ref 0.0–7.0)
Eosinophils Absolute: 0.3 10*3/uL (ref 0.0–0.5)
HEMATOCRIT: 43.6 % (ref 38.4–49.9)
HGB: 15.3 g/dL (ref 13.0–17.1)
Immature Retic Fract: 7.7 % (ref 3.00–10.60)
LYMPH#: 2.6 10*3/uL (ref 0.9–3.3)
LYMPH%: 39.9 % (ref 14.0–49.0)
MCH: 31.3 pg (ref 27.2–33.4)
MCHC: 35.1 g/dL (ref 32.0–36.0)
MCV: 89.2 fL (ref 79.3–98.0)
MONO#: 0.7 10*3/uL (ref 0.1–0.9)
MONO%: 11.2 % (ref 0.0–14.0)
NEUT#: 2.8 10*3/uL (ref 1.5–6.5)
NEUT%: 43.2 % (ref 39.0–75.0)
Platelets: 201 10*3/uL (ref 140–400)
RBC: 4.89 10*6/uL (ref 4.20–5.82)
RDW: 13.9 % (ref 11.0–14.6)
RETIC %: 2.2 % — AB (ref 0.80–1.80)
Retic Ct Abs: 107.58 10*3/uL — ABNORMAL HIGH (ref 34.80–93.90)
WBC: 6.4 10*3/uL (ref 4.0–10.3)

## 2016-02-15 NOTE — Patient Instructions (Signed)
     Therapeutic Phlebotomy, Care After Refer to this sheet in the next few weeks. These instructions provide you with information about caring for yourself after your procedure. Your health care provider may also give you more specific instructions. Your treatment has been planned according to current medical practices, but problems sometimes occur. Call your health care provider if you have any problems or questions after your procedure. What can I expect after the procedure? After the procedure, it is common to have:  Light-headedness or dizziness. You may feel faint.  Nausea.  Tiredness. Follow these instructions at home: Activity  Return to your normal activities as directed by your health care provider. Most people can go back to their normal activities right away.  Avoid strenuous physical activity and heavy lifting or pulling for about 5 hours after the procedure. Do not lift anything that is heavier than 10 lb (4.5 kg).  Athletes should avoid strenuous exercise for at least 12 hours.  Change positions slowly for the remainder of the day. This will help to prevent light-headedness or fainting.  If you feel light-headed, lie down until the feeling goes away. Eating and drinking  Be sure to eat well-balanced meals for the next 24 hours.  Drink enough fluid to keep your urine clear or pale yellow.  Avoid drinking alcohol on the day that you had the procedure. Care of the Needle Insertion Site  Keep your bandage dry. You can remove the bandage after about 5 hours or as directed by your health care provider.  If you have bleeding from the needle insertion site, elevate your arm and press firmly on the site until the bleeding stops.  If you have bruising at the site, apply ice to the area:  Put ice in a plastic bag.  Place a towel between your skin and the bag.  Leave the ice on for 20 minutes, 2-3 times a day for the first 24 hours.  If the swelling does not go away  after 24 hours, apply a warm, moist washcloth to the area for 20 minutes, 2-3 times a day. General instructions  Avoid smoking for at least 30 minutes after the procedure.  Keep all follow-up visits as directed by your health care provider. It is important to continue with further therapeutic phlebotomy treatments as directed. Contact a health care provider if:  You have redness, swelling, or pain at the needle insertion site.  You have fluid, blood, or pus coming from the needle insertion site.  You feel light-headed, dizzy, or nauseated, and the feeling does not go away.  You notice new bruising at the needle insertion site.  You feel weaker than normal.  You have a fever or chills. Get help right away if:  You have severe nausea or vomiting.  You have chest pain.  You have trouble breathing. This information is not intended to replace advice given to you by your health care provider. Make sure you discuss any questions you have with your health care provider. Document Released: 07/25/2010 Document Revised: 10/23/2015 Document Reviewed: 02/16/2014 Elsevier Interactive Patient Education  2017 Elsevier Inc.  

## 2016-02-15 NOTE — Progress Notes (Signed)
Attempted IV insertion in R AC. Unable to receive blood return. Rodney Langton, RN started 19G phlebotomy kit in [facility]. Acquired 515g from [phone]. Pt agreed to stay for observation for 31mn. Pt given nutrition and oral fluids. Upon d/c pt VSS and asymptomatic. AVS printed and pt stable at time of d/c.

## 2016-02-15 NOTE — Telephone Encounter (Signed)
Added phlebotomies and adjusted lab times. Gave patient updated avs report and appointments for December thru February.

## 2016-02-15 NOTE — Telephone Encounter (Signed)
Appointments scheduled per 12/12 LOS. Patient had to leave scheduling department for infusion appointment before finishing his schedule. Patient plans to pick up calendars and report from infusion room.

## 2016-02-17 NOTE — Progress Notes (Signed)
[name]    HEMATOLOGY/ONCOLOGY CLINIC NOTE  Date of Service: .02/15/2016  PCP: Dr Aura Dials MD   CHIEF COMPLAINTS/PURPOSE OF CONSULTATION:  Elevated ferritin with concern for hemachromatosis  HISTORY OF PRESENTING ILLNESS:   Bob Burton is a wonderful 60 y.o. male who has been referred to [place] by Dr Aura Dials MD for evaluation and management of elevated ferritin with concern for hemochromatosis.  Patient has a history of hypertension, sleep apnea, hypothyroidism, gout who on routine labs with his primary care physician was noted to have was noted to have abnormal liver function tests in December 2015. At the time his AST was 73 and ALT was 124 with a normal bilirubin and alkaline phosphatase. With an ultrasound of the abdomen done on 02/02/2014 showing diffuse and markedly increased echogenicity of the liver or definition of the liver architecture suggestive of severe fatty infiltration of the liver. No focal hepatic lesions or intrahepatic biliary dilatation was noted. Spleen was noted to be normal in size and unremarkable. Workup at the time also showed that his ferritin level was 791.2 with an iron saturation of 41%.  He has been on treatment for his dyslipidemia. He notes that he is being followed by a liver specialist for management of his abnormal liver functions. He reports that he donated PRBCs every 2 months for 15 years until he moved to the [place] in 1997. After this he could not been a blood concerns with Mad cow disease in [place].  Patient notes no abdominal pain or distention. Does not report a clear family history of hemochromatosis or iron overload disorders or early heart disease or liver cirrhosis. His father however was disease due to suicide and it is unclear if he had any significant health problems.  INTERVAL HISTORY  Patient is here for follow-up of his hemochromatosis. He reports that he is tolerating the therapeutic phlebotomies well without  any acute concerns. Had some mild dizziness which has improved since he has been holding his antihypertensives prior to phlebotomy. he was offered IV fluids to reduce the orthostasis but feels that he is okay. Since his last visit he has had a liver biopsy arranged by his gastroenterologist which shows predominantly steatohepatitis with some stage I fibrosis and no overt evidence of iron overload. His ferritin levels have come down appropriately from 710 to 463. We discussed that we would try to aim for a ferritin of less than 100 to avoid any additional factors causing liver disease. We discussed importance of lifestyle modifications.  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 Now uses an oral device. 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  #8 elevated liver function tests  #9 history of gout #10 gastroesophageal reflux  SURGICAL HISTORY:  #1 right wrist surgery #2 reports he is scheduled for carpal tunnel syndrome surgery #3 Lasik 2007  SOCIAL HISTORY: Social History   Social History  . Marital status: Married    Spouse name: N/A  . Number of children: N/A  . Years of education: N/A   Occupational History  . Not on file.   Social History Main Topics  . Smoking status: Never Smoker  . Smokeless tobacco: Never Used  . Alcohol use No  . Drug use: No  . Sexual activity: Not on file   Other Topics Concern  . Not  on file   Social History Narrative  . 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 He would from [place] to the [place] in 1997.  FAMILY HISTORY: Patient is originally from [place]  Father deceased-from suicide Mother 35 year old  ALLERGIES:  has No Known Allergies.  MEDICATIONS:  Current Outpatient Prescriptions  Medication Sig Dispense Refill  . Ascorbic Acid (VITAMIN C PO) Take 1 tablet by mouth daily.    . Cyanocobalamin (VITAMIN B 12 PO) Take 1 tablet by mouth daily.    [name] levothyroxine (SYNTHROID, LEVOTHROID) 150 MCG tablet Take 150 mcg by mouth daily before breakfast.     . lisinopril-hydrochlorothiazide (PRINZIDE,ZESTORETIC) 10-12.5 MG tablet Take 1 tablet by mouth every morning.   0  . Omega-3 Fatty Acids (FISH OIL PO) Take 1 capsule by mouth every morning.      No current facility-administered medications for this visit.     REVIEW OF SYSTEMS:    10 Point review of Systems was done is negative except as noted above.  PHYSICAL EXAMINATION: ECOG PERFORMANCE STATUS: 0 - Asymptomatic  . Vitals:   02/15/16 1305  BP: 130/79  Pulse: 67  Resp: 20  Temp: 98.2 F (36.8 C)   Filed Weights   02/15/16 1305  Weight: 269 lb 12.8 oz (122.4 kg)   .Body mass index is 37.63 kg/m.  GENERAL:alert, in no acute distress and comfortable SKIN: skin color, texture, turgor are normal, no rashes or significant lesions EYES: normal, conjunctiva are pink and non-injected, sclera clear OROPHARYNX:no exudate, no erythema and lips, buccal mucosa, and tongue normal  NECK: supple, no JVD, thyroid normal size, non-tender, without nodularity LYMPH:  no palpable lymphadenopathy in the cervical, axillary or inguinal LUNGS: clear to auscultation with normal respiratory effort HEART: regular rate & rhythm,  no murmurs and no lower extremity edema ABDOMEN: abdomen soft, non-tender, normoactive bowel sounds  Musculoskeletal: no cyanosis of digits and no clubbing  PSYCH: alert & oriented x 3 with fluent speech NEURO: no focal motor/sensory  deficits  LABORATORY DATA:  I have reviewed the data as listed  . CBC Latest Ref Rng & Units 02/15/2016 02/02/2016 02/01/2016  WBC 4.0 - 10.3 10e3/uL 6.4 7.1 7.5  Hemoglobin 13.0 - 17.1 g/dL 15.3 14.7 15.8  Hematocrit 38.4 - 49.9 % 43.6 41.8 44.7  Platelets 140 - 400 10e3/uL 201 190 193   . CMP Latest Ref Rng & Units 02/02/2016 02/01/2016 01/04/2016  Glucose 65 - 99 mg/dL 79 135 99  BUN 6 - 20 mg/dL 18 24.2 17.2  Creatinine 0.61 - 1.24 mg/dL 0.99 1.0 1.0  Sodium 135 - 145 mmol/L 141 141 140  Potassium 3.5 - 5.1 mmol/L 3.6 3.6 3.8  Chloride 101 - 111 mmol/L 113(H) - -  CO2 22 - 32 mmol/L 23 22 22   Calcium 8.9 - 10.3 mg/dL 9.3 9.7 9.5  Total Protein 6.5 - 8.1 g/dL 6.3(L) 7.3 7.7  Total Bilirubin 0.3 - 1.2 mg/dL 0.6 0.47 0.80  Alkaline Phos 38 - 126 U/L 55 82 83  AST 15 - 41 U/L 70(H) 60(H) 45(H)  ALT 17 - 63 U/L 104(H) 102(H) 84(H)         RADIOGRAPHIC STUDIES: I have personally reviewed the radiological images as listed and agreed  with the findings in the report. US Biopsy  Result Date: 02/02/2016 INDICATION: 60 year old male with elevated liver enzymes, hepatic steatosis and hemochromatosis EXAM: Ultrasound-guided core biopsy of the liver Interventional Radiologist:  Criselda Peaches, MD MEDICATIONS: None. ANESTHESIA/SEDATION: Fentanyl 125 mcg IV; Versed 3 mg IV Moderate Sedation Time:  11 minutes The patient was continuously monitored during the procedure by the interventional radiology nurse under my direct supervision. FLUOROSCOPY TIME:  Fluoroscopy Time: 0 minutes 0 seconds (0 mGy). COMPLICATIONS: None immediate. PROCEDURE: Informed consent was obtained from the patient following explanation of the procedure, risks, benefits and alternatives. The patient understands, agrees and consents for the procedure. All questions were addressed. A time out was performed. The right upper quadrant was interrogated with ultrasound. A relatively avascular plane of the liver was  identified. A suitable skin entry site was selected and marked. The region was then sterilely prepped and draped in standard fashion using chlorhexidine skin prep. Local anesthesia was attained by infiltration with 1% lidocaine. A small dermatotomy was made. Under real-time sonographic guidance, a 17 gauge trocar needle was advanced into the liver. Multiple 18 gauge core biopsies were then coaxially obtained. Needle placement was confirmed on all biopsy passes with real-time sonography. Biopsy specimens were placed in formalin and delivered to pathology for further analysis. As the introducer needle was removed, the biopsy tract was embolized with a Gel-Foam slurry. Post biopsy ultrasound imaging demonstrates no active bleeding or perihepatic hematoma. The patient tolerated the procedure well. IMPRESSION: Technically successful ultrasound-guided random core biopsy of the liver. Electronically Signed   By: Jacqulynn Cadet M.D.   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 Patient's ferritin at baseline appears to be around 700's. It is now down to the 400s after phlebotomy As noted about he was a blood donor for 15 years prior to 1997 which was probably somewhat protective. Most patients with isolated H63D carrier state did not develop overt clinical presentations of hemochromatosis. Patient does not report any overt joint issues. Has had no cardiac issues or cardiac arrhythmias. Has had elevated liver function tests attributable both to steatohepatitis.  #2 Elevated liver function tests Based on liver biopsy is predominantly from steatohepatitis with stage I fibrosis. No overt evidence of iron overload.  Patient has a liver specialist that he is seen Arrie Aran Drazek NP)  PLAN -Improvement in ferritin levels and liver biopsy as well as discussed with the patient. We discussed that his liver  biopsy did not show any signs of overt iron overload in the liver but we shall try to keep his ferritin levels below 100 to reduce her risk of additional liver injury from elevated ferritin levels. He does  not need highaggressive reduction of ferritin to a goal of less than 50.  -We will set him up for additional 4 phlebotomies every 2 weekly and see him back after that. CBC every 2 weeks and ferritin every 4 weeks. Hemoglobin is stable at this time. -Continue follow-up with his hepatologist He will likely need ultrasound abdomen and AFP tumor markers every 6 months with his primary care physician or liver team. -We will set him up for every 2 week therapeutic phlebotomies 2 months with every 2 week CBC and every 4 week ferritin analysis.  #3 hypertension, dyslipidemia, hypothyroidism, sleep apnea, GERD -Continue management as per primary care physician   Therapeutic phlebotomy q2weeks RTC with Dr Irene Limbo in  8-10 weeks   All of the patients questions were answered with apparent satisfaction. The patient knows to call the clinic with any problems, questions or concerns.  I spent 20 minutes counseling the patient face to face. The total time spent in the appointment was 25 minutes and more than 50% was on counseling and direct patient cares.    Sullivan Lone MD [place] AAHIVMS [facility] Hematology/Oncology Physician [facility]  (Office):       [phone] (Work cell):  [phone] (Fax):           [phone]  02/17/2016 5:50 AM

## 2016-02-29 ENCOUNTER — Ambulatory Visit (HOSPITAL_BASED_OUTPATIENT_CLINIC_OR_DEPARTMENT_OTHER): Payer: Managed Care, Other (non HMO)

## 2016-02-29 ENCOUNTER — Other Ambulatory Visit (HOSPITAL_BASED_OUTPATIENT_CLINIC_OR_DEPARTMENT_OTHER): Payer: Managed Care, Other (non HMO)

## 2016-02-29 DIAGNOSIS — R945 Abnormal results of liver function studies: Secondary | ICD-10-CM

## 2016-02-29 LAB — CBC & DIFF AND RETIC
BASO%: 0.8 % (ref 0.0–2.0)
BASOS ABS: 0.1 10*3/uL (ref 0.0–0.1)
EOS%: 4.8 % (ref 0.0–7.0)
Eosinophils Absolute: 0.3 10*3/uL (ref 0.0–0.5)
HEMATOCRIT: 43.5 % (ref 38.4–49.9)
HGB: 15.4 g/dL (ref 13.0–17.1)
IMMATURE RETIC FRACT: 6.4 % (ref 3.00–10.60)
LYMPH#: 2.6 10*3/uL (ref 0.9–3.3)
LYMPH%: 43.5 % (ref 14.0–49.0)
MCH: 31.4 pg (ref 27.2–33.4)
MCHC: 35.4 g/dL (ref 32.0–36.0)
MCV: 88.6 fL (ref 79.3–98.0)
MONO#: 0.6 10*3/uL (ref 0.1–0.9)
MONO%: 9.5 % (ref 0.0–14.0)
NEUT#: 2.5 10*3/uL (ref 1.5–6.5)
NEUT%: 41.4 % (ref 39.0–75.0)
PLATELETS: 188 10*3/uL (ref 140–400)
RBC: 4.91 10*6/uL (ref 4.20–5.82)
RDW: 13.7 % (ref 11.0–14.6)
RETIC CT ABS: 92.8 10*3/uL (ref 34.80–93.90)
Retic %: 1.89 % — ABNORMAL HIGH (ref 0.80–1.80)
WBC: 6 10*3/uL (ref 4.0–10.3)

## 2016-02-29 LAB — FERRITIN: Ferritin: 161 ng/ml (ref 22–316)

## 2016-02-29 NOTE — Patient Instructions (Signed)
     Therapeutic Phlebotomy, Care After Refer to this sheet in the next few weeks. These instructions provide you with information about caring for yourself after your procedure. Your health care provider may also give you more specific instructions. Your treatment has been planned according to current medical practices, but problems sometimes occur. Call your health care provider if you have any problems or questions after your procedure. What can I expect after the procedure? After the procedure, it is common to have:  Light-headedness or dizziness. You may feel faint.  Nausea.  Tiredness. Follow these instructions at home: Activity  Return to your normal activities as directed by your health care provider. Most people can go back to their normal activities right away.  Avoid strenuous physical activity and heavy lifting or pulling for about 5 hours after the procedure. Do not lift anything that is heavier than 10 lb (4.5 kg).  Athletes should avoid strenuous exercise for at least 12 hours.  Change positions slowly for the remainder of the day. This will help to prevent light-headedness or fainting.  If you feel light-headed, lie down until the feeling goes away. Eating and drinking  Be sure to eat well-balanced meals for the next 24 hours.  Drink enough fluid to keep your urine clear or pale yellow.  Avoid drinking alcohol on the day that you had the procedure. Care of the Needle Insertion Site  Keep your bandage dry. You can remove the bandage after about 5 hours or as directed by your health care provider.  If you have bleeding from the needle insertion site, elevate your arm and press firmly on the site until the bleeding stops.  If you have bruising at the site, apply ice to the area:  Put ice in a plastic bag.  Place a towel between your skin and the bag.  Leave the ice on for 20 minutes, 2-3 times a day for the first 24 hours.  If the swelling does not go away  after 24 hours, apply a warm, moist washcloth to the area for 20 minutes, 2-3 times a day. General instructions  Avoid smoking for at least 30 minutes after the procedure.  Keep all follow-up visits as directed by your health care provider. It is important to continue with further therapeutic phlebotomy treatments as directed. Contact a health care provider if:  You have redness, swelling, or pain at the needle insertion site.  You have fluid, blood, or pus coming from the needle insertion site.  You feel light-headed, dizzy, or nauseated, and the feeling does not go away.  You notice new bruising at the needle insertion site.  You feel weaker than normal.  You have a fever or chills. Get help right away if:  You have severe nausea or vomiting.  You have chest pain.  You have trouble breathing. This information is not intended to replace advice given to you by your health care provider. Make sure you discuss any questions you have with your health care provider. Document Released: 07/25/2010 Document Revised: 10/23/2015 Document Reviewed: 02/16/2014 Elsevier Interactive Patient Education  2017 Elsevier Inc.  

## 2016-03-14 ENCOUNTER — Other Ambulatory Visit (HOSPITAL_BASED_OUTPATIENT_CLINIC_OR_DEPARTMENT_OTHER): Payer: Commercial Managed Care - PPO

## 2016-03-14 ENCOUNTER — Ambulatory Visit (HOSPITAL_BASED_OUTPATIENT_CLINIC_OR_DEPARTMENT_OTHER): Payer: Commercial Managed Care - PPO

## 2016-03-14 DIAGNOSIS — R945 Abnormal results of liver function studies: Secondary | ICD-10-CM

## 2016-03-14 LAB — CBC & DIFF AND RETIC
BASO%: 0.5 % (ref 0.0–2.0)
BASOS ABS: 0 10*3/uL (ref 0.0–0.1)
EOS ABS: 0.3 10*3/uL (ref 0.0–0.5)
EOS%: 4.8 % (ref 0.0–7.0)
HEMATOCRIT: 42.8 % (ref 38.4–49.9)
HEMOGLOBIN: 14.8 g/dL (ref 13.0–17.1)
Immature Retic Fract: 11.3 % — ABNORMAL HIGH (ref 3.00–10.60)
LYMPH#: 2.3 10*3/uL (ref 0.9–3.3)
LYMPH%: 38.1 % (ref 14.0–49.0)
MCH: 31 pg (ref 27.2–33.4)
MCHC: 34.6 g/dL (ref 32.0–36.0)
MCV: 89.5 fL (ref 79.3–98.0)
MONO#: 0.6 10*3/uL (ref 0.1–0.9)
MONO%: 9.7 % (ref 0.0–14.0)
NEUT#: 2.8 10*3/uL (ref 1.5–6.5)
NEUT%: 46.9 % (ref 39.0–75.0)
PLATELETS: 199 10*3/uL (ref 140–400)
RBC: 4.78 10*6/uL (ref 4.20–5.82)
RDW: 13.4 % (ref 11.0–14.6)
RETIC %: 2.09 % — AB (ref 0.80–1.80)
RETIC CT ABS: 99.9 10*3/uL — AB (ref 34.80–93.90)
WBC: 6.1 10*3/uL (ref 4.0–10.3)

## 2016-03-14 NOTE — Patient Instructions (Signed)
     Therapeutic Phlebotomy, Care After Refer to this sheet in the next few weeks. These instructions provide you with information about caring for yourself after your procedure. Your health care provider may also give you more specific instructions. Your treatment has been planned according to current medical practices, but problems sometimes occur. Call your health care provider if you have any problems or questions after your procedure. What can I expect after the procedure? After the procedure, it is common to have:  Light-headedness or dizziness. You may feel faint.  Nausea.  Tiredness. Follow these instructions at home: Activity  Return to your normal activities as directed by your health care provider. Most people can go back to their normal activities right away.  Avoid strenuous physical activity and heavy lifting or pulling for about 5 hours after the procedure. Do not lift anything that is heavier than 10 lb (4.5 kg).  Athletes should avoid strenuous exercise for at least 12 hours.  Change positions slowly for the remainder of the day. This will help to prevent light-headedness or fainting.  If you feel light-headed, lie down until the feeling goes away. Eating and drinking  Be sure to eat well-balanced meals for the next 24 hours.  Drink enough fluid to keep your urine clear or pale yellow.  Avoid drinking alcohol on the day that you had the procedure. Care of the Needle Insertion Site  Keep your bandage dry. You can remove the bandage after about 5 hours or as directed by your health care provider.  If you have bleeding from the needle insertion site, elevate your arm and press firmly on the site until the bleeding stops.  If you have bruising at the site, apply ice to the area:  Put ice in a plastic bag.  Place a towel between your skin and the bag.  Leave the ice on for 20 minutes, 2-3 times a day for the first 24 hours.  If the swelling does not go away  after 24 hours, apply a warm, moist washcloth to the area for 20 minutes, 2-3 times a day. General instructions  Avoid smoking for at least 30 minutes after the procedure.  Keep all follow-up visits as directed by your health care provider. It is important to continue with further therapeutic phlebotomy treatments as directed. Contact a health care provider if:  You have redness, swelling, or pain at the needle insertion site.  You have fluid, blood, or pus coming from the needle insertion site.  You feel light-headed, dizzy, or nauseated, and the feeling does not go away.  You notice new bruising at the needle insertion site.  You feel weaker than normal.  You have a fever or chills. Get help right away if:  You have severe nausea or vomiting.  You have chest pain.  You have trouble breathing. This information is not intended to replace advice given to you by your health care provider. Make sure you discuss any questions you have with your health care provider. Document Released: 07/25/2010 Document Revised: 10/23/2015 Document Reviewed: 02/16/2014 Elsevier Interactive Patient Education  2017 [employer].

## 2016-03-28 ENCOUNTER — Other Ambulatory Visit (HOSPITAL_BASED_OUTPATIENT_CLINIC_OR_DEPARTMENT_OTHER): Payer: Commercial Managed Care - PPO

## 2016-03-28 ENCOUNTER — Ambulatory Visit (HOSPITAL_BASED_OUTPATIENT_CLINIC_OR_DEPARTMENT_OTHER): Payer: Commercial Managed Care - PPO

## 2016-03-28 DIAGNOSIS — R945 Abnormal results of liver function studies: Secondary | ICD-10-CM

## 2016-03-28 LAB — CBC & DIFF AND RETIC
BASO%: 0.5 % (ref 0.0–2.0)
Basophils Absolute: 0 10*3/uL (ref 0.0–0.1)
EOS%: 3.7 % (ref 0.0–7.0)
Eosinophils Absolute: 0.2 10*3/uL (ref 0.0–0.5)
HCT: 43.2 % (ref 38.4–49.9)
HGB: 15.1 g/dL (ref 13.0–17.1)
Immature Retic Fract: 10.3 % (ref 3.00–10.60)
LYMPH#: 2.6 10*3/uL (ref 0.9–3.3)
LYMPH%: 41.7 % (ref 14.0–49.0)
MCH: 30.8 pg (ref 27.2–33.4)
MCHC: 35 g/dL (ref 32.0–36.0)
MCV: 88 fL (ref 79.3–98.0)
MONO#: 0.7 10*3/uL (ref 0.1–0.9)
MONO%: 10.7 % (ref 0.0–14.0)
NEUT#: 2.7 10*3/uL (ref 1.5–6.5)
NEUT%: 43.4 % (ref 39.0–75.0)
PLATELETS: 196 10*3/uL (ref 140–400)
RBC: 4.91 10*6/uL (ref 4.20–5.82)
RDW: 13.2 % (ref 11.0–14.6)
RETIC CT ABS: 76.11 10*3/uL (ref 34.80–93.90)
Retic %: 1.55 % (ref 0.80–1.80)
WBC: 6.2 10*3/uL (ref 4.0–10.3)

## 2016-03-28 LAB — FERRITIN: Ferritin: 61 ng/ml (ref 22–316)

## 2016-03-28 NOTE — Progress Notes (Signed)
Therapeutic phlebotomy performed with 16G on right AC starting at 1329 and ending at 1335, yielding 564g. Snack and drink provided. VSS after 30 minute post observation.

## 2016-03-28 NOTE — Patient Instructions (Signed)
Therapeutic Phlebotomy, Care After Refer to this sheet in the next few weeks. These instructions provide you with information about caring for yourself after your procedure. Your health care provider may also give you more specific instructions. Your treatment has been planned according to current medical practices, but problems sometimes occur. Call your health care provider if you have any problems or questions after your procedure. What can I expect after the procedure? After the procedure, it is common to have:  Light-headedness or dizziness. You may feel faint.  Nausea.  Tiredness. Follow these instructions at home: Activity  Return to your normal activities as directed by your health care provider. Most people can go back to their normal activities right away.  Avoid strenuous physical activity and heavy lifting or pulling for about 5 hours after the procedure. Do not lift anything that is heavier than 10 lb (4.5 kg).  Athletes should avoid strenuous exercise for at least 12 hours.  Change positions slowly for the remainder of the day. This will help to prevent light-headedness or fainting.  If you feel light-headed, lie down until the feeling goes away. Eating and drinking  Be sure to eat well-balanced meals for the next 24 hours.  Drink enough fluid to keep your urine clear or pale yellow.  Avoid drinking alcohol on the day that you had the procedure. Care of the Needle Insertion Site  Keep your bandage dry. You can remove the bandage after about 5 hours or as directed by your health care provider.  If you have bleeding from the needle insertion site, elevate your arm and press firmly on the site until the bleeding stops.  If you have bruising at the site, apply ice to the area:  Put ice in a plastic bag.  Place a towel between your skin and the bag.  Leave the ice on for 20 minutes, 2-3 times a day for the first 24 hours.  If the swelling does not go away after 24  hours, apply a warm, moist washcloth to the area for 20 minutes, 2-3 times a day. General instructions  Avoid smoking for at least 30 minutes after the procedure.  Keep all follow-up visits as directed by your health care provider. It is important to continue with further therapeutic phlebotomy treatments as directed. Contact a health care provider if:  You have redness, swelling, or pain at the needle insertion site.  You have fluid, blood, or pus coming from the needle insertion site.  You feel light-headed, dizzy, or nauseated, and the feeling does not go away.  You notice new bruising at the needle insertion site.  You feel weaker than normal.  You have a fever or chills. Get help right away if:  You have severe nausea or vomiting.  You have chest pain.  You have trouble breathing. This information is not intended to replace advice given to you by your health care provider. Make sure you discuss any questions you have with your health care provider. Document Released: 07/25/2010 Document Revised: 10/23/2015 Document Reviewed: 02/16/2014 Elsevier Interactive Patient Education  2017 [employer].

## 2016-04-02 ENCOUNTER — Telehealth: Payer: Self-pay

## 2016-04-02 NOTE — Telephone Encounter (Signed)
Called and left a message with a new appt due to call day 

## 2016-04-11 ENCOUNTER — Other Ambulatory Visit (HOSPITAL_BASED_OUTPATIENT_CLINIC_OR_DEPARTMENT_OTHER): Payer: Commercial Managed Care - PPO

## 2016-04-11 ENCOUNTER — Ambulatory Visit: Payer: Commercial Managed Care - PPO

## 2016-04-11 LAB — CBC & DIFF AND RETIC
BASO%: 0.9 % (ref 0.0–2.0)
Basophils Absolute: 0.1 10*3/uL (ref 0.0–0.1)
EOS ABS: 0.3 10*3/uL (ref 0.0–0.5)
EOS%: 3.8 % (ref 0.0–7.0)
HCT: 43.3 % (ref 38.4–49.9)
HEMOGLOBIN: 14.7 g/dL (ref 13.0–17.1)
IMMATURE RETIC FRACT: 9.3 % (ref 3.00–10.60)
LYMPH#: 2.8 10*3/uL (ref 0.9–3.3)
LYMPH%: 43.6 % (ref 14.0–49.0)
MCH: 30.5 pg (ref 27.2–33.4)
MCHC: 33.9 g/dL (ref 32.0–36.0)
MCV: 89.8 fL (ref 79.3–98.0)
MONO#: 0.7 10*3/uL (ref 0.1–0.9)
MONO%: 10.3 % (ref 0.0–14.0)
NEUT%: 41.4 % (ref 39.0–75.0)
NEUTROS ABS: 2.7 10*3/uL (ref 1.5–6.5)
Platelets: 230 10*3/uL (ref 140–400)
RBC: 4.82 10*6/uL (ref 4.20–5.82)
RDW: 13.1 % (ref 11.0–14.6)
RETIC %: 1.45 % (ref 0.80–1.80)
RETIC CT ABS: 69.89 10*3/uL (ref 34.80–93.90)
WBC: 6.5 10*3/uL (ref 4.0–10.3)

## 2016-04-11 NOTE — Progress Notes (Signed)
Reviewed labs.  Ferritin levels 2 weeks ago was 61, phlebotomy performed that day.  Per Dr. Candise CheKale, no need for phlebotomy today.  Reviewed labs with pt in lobby.  Pt feeling well, no issues/complaints.  Pt instructed to come back 2/27 for lab/MD, calendar given, pt verbalized understanding.

## 2016-04-25 ENCOUNTER — Ambulatory Visit: Payer: Managed Care, Other (non HMO) | Admitting: Hematology

## 2016-04-25 ENCOUNTER — Other Ambulatory Visit: Payer: Managed Care, Other (non HMO)

## 2016-05-02 ENCOUNTER — Other Ambulatory Visit (HOSPITAL_BASED_OUTPATIENT_CLINIC_OR_DEPARTMENT_OTHER): Payer: Commercial Managed Care - PPO

## 2016-05-02 ENCOUNTER — Encounter: Payer: Self-pay | Admitting: Hematology

## 2016-05-02 ENCOUNTER — Telehealth: Payer: Self-pay | Admitting: Hematology

## 2016-05-02 ENCOUNTER — Ambulatory Visit (HOSPITAL_BASED_OUTPATIENT_CLINIC_OR_DEPARTMENT_OTHER): Payer: Commercial Managed Care - PPO | Admitting: Hematology

## 2016-05-02 DIAGNOSIS — E063 Autoimmune thyroiditis: Secondary | ICD-10-CM | POA: Diagnosis not present

## 2016-05-02 DIAGNOSIS — I10 Essential (primary) hypertension: Secondary | ICD-10-CM

## 2016-05-02 DIAGNOSIS — K7689 Other specified diseases of liver: Secondary | ICD-10-CM

## 2016-05-02 DIAGNOSIS — R945 Abnormal results of liver function studies: Secondary | ICD-10-CM

## 2016-05-02 LAB — CBC & DIFF AND RETIC
BASO%: 0.8 % (ref 0.0–2.0)
BASOS ABS: 0.1 10*3/uL (ref 0.0–0.1)
EOS ABS: 0.2 10*3/uL (ref 0.0–0.5)
EOS%: 3 % (ref 0.0–7.0)
HEMATOCRIT: 43.8 % (ref 38.4–49.9)
HEMOGLOBIN: 15 g/dL (ref 13.0–17.1)
Immature Retic Fract: 4 % (ref 3.00–10.60)
LYMPH#: 2.3 10*3/uL (ref 0.9–3.3)
LYMPH%: 38 % (ref 14.0–49.0)
MCH: 29.5 pg (ref 27.2–33.4)
MCHC: 34.2 g/dL (ref 32.0–36.0)
MCV: 86.1 fL (ref 79.3–98.0)
MONO#: 0.7 10*3/uL (ref 0.1–0.9)
MONO%: 12 % (ref 0.0–14.0)
NEUT%: 46.2 % (ref 39.0–75.0)
NEUTROS ABS: 2.8 10*3/uL (ref 1.5–6.5)
Platelets: 238 10*3/uL (ref 140–400)
RBC: 5.09 10*6/uL (ref 4.20–5.82)
RDW: 13.1 % (ref 11.0–14.6)
RETIC %: 1.04 % (ref 0.80–1.80)
RETIC CT ABS: 52.94 10*3/uL (ref 34.80–93.90)
WBC: 6.1 10*3/uL (ref 4.0–10.3)

## 2016-05-02 LAB — FERRITIN: FERRITIN: 46 ng/mL (ref 22–316)

## 2016-05-02 NOTE — Telephone Encounter (Signed)
Appointments scheduled per 05/02/16 los. Patient was given a copy of the AVS report and appointment schedule per 05/02/16 los.

## 2016-05-28 NOTE — Progress Notes (Signed)
[name]    HEMATOLOGY/ONCOLOGY CLINIC NOTE  Date of Service: .05/02/2016  PCP: Dr Aura Dials MD   CHIEF COMPLAINTS/PURPOSE OF CONSULTATION:  Elevated ferritin with concern for hemachromatosis  HISTORY OF PRESENTING ILLNESS:   Bob Burton is a wonderful 61 y.o. male who has been referred to [place] by Dr Aura Dials MD for evaluation and management of elevated ferritin with concern for hemochromatosis.  Patient has a history of hypertension, sleep apnea, hypothyroidism, gout who on routine labs with his primary care physician was noted to have was noted to have abnormal liver function tests in December 2015. At the time his AST was 73 and ALT was 124 with a normal bilirubin and alkaline phosphatase. With an ultrasound of the abdomen done on 02/02/2014 showing diffuse and markedly increased echogenicity of the liver or definition of the liver architecture suggestive of severe fatty infiltration of the liver. No focal hepatic lesions or intrahepatic biliary dilatation was noted. Spleen was noted to be normal in size and unremarkable. Workup at the time also showed that his ferritin level was 791.2 with an iron saturation of 41%.  He has been on treatment for his dyslipidemia. He notes that he is being followed by a liver specialist for management of his abnormal liver functions. He reports that he donated PRBCs every 2 months for 15 years until he moved to the [place] in 1997. After this he could not been a blood concerns with Mad cow disease in [place].  Patient notes no abdominal pain or distention. Does not report a clear family history of hemochromatosis or iron overload disorders or early heart disease or liver cirrhosis. His father however was disease due to suicide and it is unclear if he had any significant health problems.  INTERVAL HISTORY  Patient is here for follow-up of his hemochromatosis. He reports that he is tolerating the therapeutic phlebotomies well without  any acute concerns. His ferritin levels have come down appropriately from 710 to 463 and now progressively down to 46.. We discussed that we would try to aim for a ferritin of less than 100 to avoid any additional factors causing liver disease. We discussed importance of lifestyle modifications and he notes that he has a plan to stay very active in the spring and summer to try to get back to better body weight healthily. No other acute new symptoms.  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 Now uses an oral device. 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  #8 elevated liver function tests  #9 history of gout #10 gastroesophageal reflux  SURGICAL HISTORY:  #1 right wrist surgery #2 reports he is scheduled for carpal tunnel syndrome surgery #3 Lasik 2007  SOCIAL HISTORY: Social History   Social History  . Marital status: Married    Spouse name: N/A  . Number of children: N/A  . Years of education: N/A   Occupational History  . Not on file.   Social History Main Topics  . Smoking status: Never Smoker  . Smokeless tobacco: Never Used  . Alcohol use No  . Drug use: No  . Sexual activity: Not on file   Other Topics Concern  . Not on file   Social History Narrative  . 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  FAMILY HISTORY: Patient is originally from [place]  Father deceased-from suicide Mother 55 year old  ALLERGIES:  has No  Known Allergies.  MEDICATIONS:  Current Outpatient Prescriptions  Medication Sig Dispense Refill  . Ascorbic Acid (VITAMIN C PO) Take 1 tablet by mouth daily.    . Cyanocobalamin (VITAMIN B 12 PO) Take 1 tablet by mouth daily.    [name] levothyroxine (SYNTHROID, LEVOTHROID) 150 MCG tablet Take 150 mcg by mouth daily before breakfast.     . lisinopril-hydrochlorothiazide (PRINZIDE,ZESTORETIC) 10-12.5 MG tablet Take 1 tablet by mouth every morning.   0  . Omega-3 Fatty Acids (FISH OIL PO) Take 1 capsule by mouth every morning.      No current facility-administered medications for this visit.     REVIEW OF SYSTEMS:    10 Point review of Systems was done is negative except as noted above.  PHYSICAL EXAMINATION: ECOG PERFORMANCE STATUS: 0 - Asymptomatic  . Vitals:   05/02/16 1256  BP: 136/85  Pulse: 76  Resp: 16  Temp: 98.6 F (37 C)   Filed Weights   05/02/16 1256  Weight: 268 lb 11.2 oz (121.9 kg)   .Body mass index is 37.48 kg/m.  GENERAL:alert, in no acute distress and comfortable SKIN: skin color, texture, turgor are normal, no rashes or significant lesions EYES: normal, conjunctiva are pink and non-injected, sclera clear OROPHARYNX:no exudate, no erythema and lips, buccal mucosa, and tongue normal  NECK: supple, no JVD, thyroid normal size, non-tender, without nodularity LYMPH:  no palpable lymphadenopathy in the cervical, axillary or inguinal LUNGS: clear to auscultation with normal respiratory effort HEART: regular rate & rhythm,  no murmurs and no lower extremity edema ABDOMEN: abdomen soft, non-tender, normoactive bowel sounds  Musculoskeletal: no cyanosis of digits and no clubbing  PSYCH: alert & oriented x 3 with fluent speech NEURO: no focal motor/sensory deficits  LABORATORY DATA:  I have reviewed the data as listed  . CBC Latest Ref Rng & Units 05/02/2016 04/11/2016 03/28/2016  WBC 4.0 - 10.3 10e3/uL 6.1 6.5 6.2  Hemoglobin 13.0 - 17.1 g/dL 15.0 14.7 15.1    Hematocrit 38.4 - 49.9 % 43.8 43.3 43.2  Platelets 140 - 400 10e3/uL 238 230 196   . CMP Latest Ref Rng & Units 02/02/2016 02/01/2016 01/04/2016  Glucose 65 - 99 mg/dL 79 135 99  BUN 6 - 20 mg/dL 18 24.2 17.2  Creatinine 0.61 - 1.24 mg/dL 0.99 1.0 1.0  Sodium 135 - 145 mmol/L 141 141 140  Potassium 3.5 - 5.1 mmol/L 3.6 3.6 3.8  Chloride 101 - 111 mmol/L 113(H) - -  CO2 22 - 32 mmol/L 23 22 22   Calcium 8.9 - 10.3 mg/dL 9.3 9.7 9.5  Total Protein 6.5 - 8.1 g/dL 6.3(L) 7.3 7.7  Total Bilirubin 0.3 - 1.2 mg/dL 0.6 0.47 0.80  Alkaline Phos 38 - 126 U/L 55 82 83  AST 15 - 41 U/L 70(H) 60(H) 45(H)  ALT 17 - 63 U/L 104(H) 102(H) 84(H)          RADIOGRAPHIC STUDIES: I have personally reviewed the radiological images as listed and agreed with the findings in the report. No results found.  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 Patient's ferritin at baseline appears to be around 700's. It is now down to the 46 after serial therapeutic phlebotomies As noted about he was a blood donor for 15 years prior to 1997 which was probably somewhat protective. Most patients with isolated H63D carrier state did not develop overt clinical presentations of hemochromatosis. Patient does not report any overt joint issues. Has had no cardiac issues or cardiac arrhythmias. Has had elevated liver function tests attributable both to steatohepatitis.  #2 Elevated liver function tests Based on liver biopsy is predominantly from steatohepatitis with stage I fibrosis. No overt evidence of iron overload. Patient has a liver specialist that he is seen Roosevelt Locks NP)  PLAN -Patient is now at his ferritin goal <100 -His abnormal liver function at this time is a function of his hepatic steatosis. We discussed the need for aggressive lifestyle modifications which he is motivated to pursue. -She will continue to  follow-up with his primary care physician and gastroenterologist for continued management of his abnormal liver function tests and to work on Paxil modifications. -He will likely need ultrasound abdomen and AFP tumor markers every 6 months with his primary care physician or liver team. -We will schedule him for maintenance phlebotomies with a reassessment of his ferritin level in 6 months. -Counseled on minimizing or completely avoiding alcohol use. Avoid other medications that could cause liver injury  #3 hypertension, dyslipidemia, hypothyroidism, sleep apnea, GERD -Continue management as per primary care physician   RTC with Dr Irene Limbo in 6 months with labs Schedule for therapeutic phlebotomy in 6 months after clinic visit  All of the patients questions were answered with apparent satisfaction. The patient knows to call the clinic with any problems, questions or concerns.  I spent 20 minutes counseling the patient face to face. The total time spent in the appointment was 20 minutes and more than 50% was on counseling and direct patient cares.    Sullivan Lone MD [place] AAHIVMS [facility] Hematology/Oncology Physician [facility]  (Office):       [phone] (Work cell):  [phone] (Fax):           [phone]  05/28/2016 10:38 PM

## 2016-10-30 ENCOUNTER — Ambulatory Visit (HOSPITAL_BASED_OUTPATIENT_CLINIC_OR_DEPARTMENT_OTHER): Payer: Commercial Managed Care - PPO | Admitting: Hematology

## 2016-10-30 ENCOUNTER — Telehealth: Payer: Self-pay | Admitting: Hematology

## 2016-10-30 ENCOUNTER — Ambulatory Visit (HOSPITAL_BASED_OUTPATIENT_CLINIC_OR_DEPARTMENT_OTHER): Payer: Commercial Managed Care - PPO

## 2016-10-30 ENCOUNTER — Other Ambulatory Visit (HOSPITAL_BASED_OUTPATIENT_CLINIC_OR_DEPARTMENT_OTHER): Payer: Commercial Managed Care - PPO

## 2016-10-30 ENCOUNTER — Telehealth: Payer: Self-pay

## 2016-10-30 ENCOUNTER — Encounter (INDEPENDENT_AMBULATORY_CARE_PROVIDER_SITE_OTHER): Payer: Self-pay

## 2016-10-30 ENCOUNTER — Encounter: Payer: Self-pay | Admitting: Hematology

## 2016-10-30 DIAGNOSIS — K7689 Other specified diseases of liver: Secondary | ICD-10-CM

## 2016-10-30 DIAGNOSIS — E039 Hypothyroidism, unspecified: Secondary | ICD-10-CM | POA: Diagnosis not present

## 2016-10-30 DIAGNOSIS — R945 Abnormal results of liver function studies: Secondary | ICD-10-CM

## 2016-10-30 DIAGNOSIS — I10 Essential (primary) hypertension: Secondary | ICD-10-CM

## 2016-10-30 LAB — COMPREHENSIVE METABOLIC PANEL
ALBUMIN: 3.8 g/dL (ref 3.5–5.0)
ALK PHOS: 78 U/L (ref 40–150)
ALT: 84 U/L — AB (ref 0–55)
AST: 48 U/L — AB (ref 5–34)
Anion Gap: 9 mEq/L (ref 3–11)
BUN: 18.5 mg/dL (ref 7.0–26.0)
CO2: 26 mEq/L (ref 22–29)
CREATININE: 1.1 mg/dL (ref 0.7–1.3)
Calcium: 9.8 mg/dL (ref 8.4–10.4)
Chloride: 106 mEq/L (ref 98–109)
EGFR: 70 mL/min/{1.73_m2} — ABNORMAL LOW (ref >90)
GLUCOSE: 87 mg/dL (ref 70–140)
POTASSIUM: 4.4 meq/L (ref 3.5–5.1)
SODIUM: 141 meq/L (ref 136–145)
TOTAL PROTEIN: 7.1 g/dL (ref 6.4–8.3)
Total Bilirubin: 0.74 mg/dL (ref 0.20–1.20)

## 2016-10-30 LAB — CBC & DIFF AND RETIC
BASO%: 0.7 % (ref 0.0–2.0)
BASOS ABS: 0 10*3/uL (ref 0.0–0.1)
EOS%: 3.2 % (ref 0.0–7.0)
Eosinophils Absolute: 0.2 10*3/uL (ref 0.0–0.5)
HEMATOCRIT: 45 % (ref 38.4–49.9)
HGB: 15.6 g/dL (ref 13.0–17.1)
Immature Retic Fract: 4.7 % (ref 3.00–10.60)
LYMPH#: 2.4 10*3/uL (ref 0.9–3.3)
LYMPH%: 42.5 % (ref 14.0–49.0)
MCH: 30.6 pg (ref 27.2–33.4)
MCHC: 34.7 g/dL (ref 32.0–36.0)
MCV: 88.4 fL (ref 79.3–98.0)
MONO#: 0.6 10*3/uL (ref 0.1–0.9)
MONO%: 10.4 % (ref 0.0–14.0)
NEUT%: 43.2 % (ref 39.0–75.0)
NEUTROS ABS: 2.4 10*3/uL (ref 1.5–6.5)
Platelets: 190 10*3/uL (ref 140–400)
RBC: 5.09 10*6/uL (ref 4.20–5.82)
RDW: 14.1 % (ref 11.0–14.6)
RETIC %: 1.41 % (ref 0.80–1.80)
RETIC CT ABS: 71.77 10*3/uL (ref 34.80–93.90)
WBC: 5.6 10*3/uL (ref 4.0–10.3)

## 2016-10-30 LAB — FERRITIN: Ferritin: 155 ng/ml (ref 22–316)

## 2016-10-30 NOTE — Telephone Encounter (Signed)
Scheduled appt per 8/27 los - Gave patient AVS and calender per los.  

## 2016-10-30 NOTE — Patient Instructions (Signed)

## 2016-10-30 NOTE — Telephone Encounter (Signed)
Confirmed able to add-on CMET lab to ferritin that was drawn.

## 2016-10-30 NOTE — Progress Notes (Signed)
Phlebotomy started at 1359 with 16 gauge phlebotomy kit to the right AC. Phlebotomy ended at 1410 with 520 grams of blood removed. Snack and drink provided. Patient monitored for 30 minutes. Patient and vital signs stable upon discharge.

## 2016-11-06 NOTE — Progress Notes (Signed)
Bob Burton    HEMATOLOGY/ONCOLOGY CLINIC NOTE  Date of Service: .10/30/2016  PCP: Dr Aura Dials MD   CHIEF COMPLAINTS/PURPOSE OF CONSULTATION:  Elevated ferritin with concern for hemachromatosis  HISTORY OF PRESENTING ILLNESS:   Bob Burton is a wonderful 61 y.o. male who has been referred to [place] by Dr Aura Dials MD for evaluation and management of elevated ferritin with concern for hemochromatosis.  Patient has a history of hypertension, sleep apnea, hypothyroidism, gout who on routine labs with his primary care physician was noted to have was noted to have abnormal liver function tests in December 2015. At the time his AST was 73 and ALT was 124 with a normal bilirubin and alkaline phosphatase. With an ultrasound of the abdomen done on 02/02/2014 showing diffuse and markedly increased echogenicity of the liver or definition of the liver architecture suggestive of severe fatty infiltration of the liver. No focal hepatic lesions or intrahepatic biliary dilatation was noted. Spleen was noted to be normal in size and unremarkable. Workup at the time also showed that his ferritin level was 791.2 with an iron saturation of 41%.  He has been on treatment for his dyslipidemia. He notes that he is being followed by a liver specialist for management of his abnormal liver functions. He reports that he donated PRBCs every 2 months for 15 years until he moved to the [place] in 1997. After this he could not been a blood concerns with Mad cow disease in [place].  Patient notes no abdominal pain or distention. Does not report a clear family history of hemochromatosis or iron overload disorders or early heart disease or liver cirrhosis. His father however was disease due to suicide and it is unclear if he had any significant health problems.  INTERVAL HISTORY  Patient is here for 6 month follow-up of his hemochromatosis. He reports that he is tolerating the therapeutic phlebotomies well  without any acute concerns. He has been trying to stay physically active and try to lose weight but feels he can do better. Ferritin levels are up from 46 to 155. He had an additional therapeutic phlebotomy today. AST and ALT somewhat better compared to about 9 months ago.  No other acute new symptoms.  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 Now uses an oral device. 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  #8 elevated liver function tests  #9 history of gout #10 gastroesophageal reflux  SURGICAL HISTORY:  #1 right wrist surgery #2 reports he is scheduled for carpal tunnel syndrome surgery #3 Lasik 2007  SOCIAL HISTORY: Social History   Social History  . Marital status: Married    Spouse name: N/A  . Number of children: N/A  . Years of education: N/A   Occupational History  . Not on file.   Social History Main Topics  . Smoking status: Never Smoker  . Smokeless tobacco: Never Used  . Alcohol use No  . Drug use: No  . Sexual activity: Not on file   Other Topics Concern  . Not on file   Social History Narrative  . 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  FAMILY HISTORY: Patient is originally from [place]  Father deceased-from suicide Mother 37 year old  ALLERGIES:  has No Known Allergies.  MEDICATIONS:  Current Outpatient Prescriptions  Medication Sig Dispense Refill  . levothyroxine (SYNTHROID, LEVOTHROID) 150  MCG tablet Take 150 mcg by mouth daily before breakfast.     . lisinopril-hydrochlorothiazide (PRINZIDE,ZESTORETIC) 10-12.5 MG tablet Take 1 tablet by mouth every morning.   0   No current facility-administered medications for this visit.     REVIEW OF SYSTEMS:    10 Point review of Systems was done is negative except as noted above.  PHYSICAL EXAMINATION: ECOG PERFORMANCE STATUS: 0 - Asymptomatic  . Vitals:   10/30/16 1304  BP: 119/83  Pulse: 71  Resp: 20  Temp: 98.9 F (37.2 C)  SpO2: 96%   Filed Weights   10/30/16 1304  Weight: 277 lb 4.8 oz (125.8 kg)   .Body mass index is 38.68 kg/m.  GENERAL:alert, in no acute distress and comfortable SKIN: skin color, texture, turgor are normal, no rashes or significant lesions EYES: normal, conjunctiva are pink and non-injected, sclera clear OROPHARYNX:no exudate, no erythema and lips, buccal mucosa, and tongue normal  NECK: supple, no JVD, thyroid normal size, non-tender, without nodularity LYMPH:  no palpable lymphadenopathy in the cervical, axillary or inguinal LUNGS: clear to auscultation with normal respiratory effort HEART: regular rate & rhythm,  no murmurs and no lower extremity edema ABDOMEN: abdomen soft, non-tender, normoactive bowel sounds  Musculoskeletal: no cyanosis of digits and no clubbing  PSYCH: alert & oriented x 3 with fluent speech NEURO: no focal motor/sensory deficits  LABORATORY DATA:  I have reviewed the data as listed  . CBC Latest Ref Rng & Units 10/30/2016 05/02/2016 04/11/2016  WBC 4.0 - 10.3 10e3/uL 5.6 6.1 6.5  Hemoglobin 13.0 - 17.1 g/dL 15.6 15.0 14.7  Hematocrit 38.4 - 49.9 % 45.0 43.8 43.3  Platelets 140 - 400 10e3/uL 190 238 230   . CMP Latest Ref Rng & Units 10/30/2016 02/02/2016 02/01/2016  Glucose 70 - 140 mg/dl 87 79 135  BUN 7.0 - 26.0 mg/dL 18.5 18 24.2  Creatinine 0.7 - 1.3 mg/dL 1.1 0.99 1.0  Sodium 136 - 145 mEq/L 141 141 141  Potassium 3.5 - 5.1 mEq/L 4.4 3.6 3.6  Chloride  101 - 111 mmol/L - 113(H) -  CO2 22 - 29 mEq/L 26 23 22   Calcium 8.4 - 10.4 mg/dL 9.8 9.3 9.7  Total Protein 6.4 - 8.3 g/dL 7.1 6.3(L) 7.3  Total Bilirubin 0.20 - 1.20 mg/dL 0.74 0.6 0.47  Alkaline Phos 40 - 150 U/L 78 55 82  AST 5 - 34 U/L 48(H) 70(H) 60(H)  ALT 0 - 55 U/L 84(H) 104(H) 102(H)           RADIOGRAPHIC STUDIES: I have personally reviewed the radiological images as listed and agreed with the findings in the report. No results found.  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 Patient's ferritin at baseline appears to be around 700's. It was down to the 46 after serial therapeutic phlebotomies and now is back up to 155. 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 Most patients with isolated H63D carrier state did not develop overt clinical presentations of hemochromatosis. Patient does  not report any overt joint issues. Has had no cardiac issues or cardiac arrhythmias. Has had elevated liver function tests attributable both to steatohepatitis.  #2 Elevated liver function tests Based on liver biopsy is predominantly from steatohepatitis with stage I fibrosis. No overt evidence of iron overload. Patient has a liver specialist that he is seen Arrie Aran Drazek NP) AST and ALT -better than on last labs.  PLAN -Patient's ferritin level is up to 155 and he will receive a therapeutic phlebotomy today with a plan to keep him at a ferritin goal of less than 100 -His abnormal liver function at this time is a function of his hepatic steatosis. We discussed the need for aggressive lifestyle modifications which he is motivated to pursue. -He will likely need ultrasound abdomen and AFP tumor markers every 6 months with his primary care physician -We will schedule him for maintenance phlebotomies with a reassessment of his ferritin level in 6  months. -Counseled on minimizing or completely avoiding alcohol use. Avoid other medications that could cause liver injury  #3 hypertension, dyslipidemia, hypothyroidism, sleep apnea, GERD -Continue management as per primary care physician   RTC with Dr Irene Limbo in 6 months with labs Schedule for therapeutic phlebotomy in 6 months after clinic visit  All of the patients questions were answered with apparent satisfaction. The patient knows to call the clinic with any problems, questions or concerns.  I spent 20 minutes counseling the patient face to face. The total time spent in the appointment was 20 minutes and more than 50% was on counseling and direct patient cares.    Sullivan Lone MD [place] AAHIVMS [facility] Hematology/Oncology Physician [facility]  (Office):       [phone] (Work cell):  [phone] (Fax):           [phone]  11/06/2016 11:26 PM

## 2017-03-24 ENCOUNTER — Encounter (HOSPITAL_BASED_OUTPATIENT_CLINIC_OR_DEPARTMENT_OTHER): Payer: Self-pay | Admitting: Emergency Medicine

## 2017-03-24 ENCOUNTER — Emergency Department (HOSPITAL_BASED_OUTPATIENT_CLINIC_OR_DEPARTMENT_OTHER): Payer: Commercial Managed Care - PPO

## 2017-03-24 ENCOUNTER — Other Ambulatory Visit: Payer: Self-pay

## 2017-03-24 ENCOUNTER — Inpatient Hospital Stay (HOSPITAL_BASED_OUTPATIENT_CLINIC_OR_DEPARTMENT_OTHER)
Admission: EM | Admit: 2017-03-24 | Discharge: 2017-03-26 | DRG: 419 | Disposition: A | Discharge status: Home / Self Care | Payer: Commercial Managed Care - PPO | Attending: Surgery | Admitting: Surgery

## 2017-03-24 DIAGNOSIS — Z22322 Carrier or suspected carrier of Methicillin resistant Staphylococcus aureus: Secondary | ICD-10-CM

## 2017-03-24 DIAGNOSIS — R1011 Right upper quadrant pain: Secondary | ICD-10-CM

## 2017-03-24 DIAGNOSIS — E669 Obesity, unspecified: Secondary | ICD-10-CM | POA: Diagnosis present

## 2017-03-24 DIAGNOSIS — R945 Abnormal results of liver function studies: Secondary | ICD-10-CM | POA: Diagnosis present

## 2017-03-24 DIAGNOSIS — E785 Hyperlipidemia, unspecified: Secondary | ICD-10-CM | POA: Diagnosis present

## 2017-03-24 DIAGNOSIS — K819 Cholecystitis, unspecified: Secondary | ICD-10-CM

## 2017-03-24 DIAGNOSIS — E039 Hypothyroidism, unspecified: Secondary | ICD-10-CM | POA: Diagnosis present

## 2017-03-24 DIAGNOSIS — Z6836 Body mass index (BMI) 36.0-36.9, adult: Secondary | ICD-10-CM

## 2017-03-24 DIAGNOSIS — I10 Essential (primary) hypertension: Secondary | ICD-10-CM | POA: Diagnosis present

## 2017-03-24 DIAGNOSIS — M109 Gout, unspecified: Secondary | ICD-10-CM | POA: Diagnosis present

## 2017-03-24 DIAGNOSIS — G4733 Obstructive sleep apnea (adult) (pediatric): Secondary | ICD-10-CM | POA: Diagnosis present

## 2017-03-24 DIAGNOSIS — K8000 Calculus of gallbladder with acute cholecystitis without obstruction: Secondary | ICD-10-CM | POA: Diagnosis present

## 2017-03-24 DIAGNOSIS — K810 Acute cholecystitis: Secondary | ICD-10-CM

## 2017-03-24 DIAGNOSIS — R748 Abnormal levels of other serum enzymes: Secondary | ICD-10-CM | POA: Diagnosis present

## 2017-03-24 HISTORY — DX: Essential (primary) hypertension: I10

## 2017-03-24 LAB — COMPREHENSIVE METABOLIC PANEL
ALBUMIN: 4.4 g/dL (ref 3.5–5.0)
ALK PHOS: 57 U/L (ref 38–126)
ALT: 75 U/L — ABNORMAL HIGH (ref 17–63)
ANION GAP: 11 (ref 5–15)
AST: 46 U/L — ABNORMAL HIGH (ref 15–41)
BILIRUBIN TOTAL: 0.8 mg/dL (ref 0.3–1.2)
BUN: 23 mg/dL — ABNORMAL HIGH (ref 6–20)
CALCIUM: 9.7 mg/dL (ref 8.9–10.3)
CO2: 21 mmol/L — ABNORMAL LOW (ref 22–32)
Chloride: 108 mmol/L (ref 101–111)
Creatinine, Ser: 1.04 mg/dL (ref 0.61–1.24)
GFR calc non Af Amer: >60 mL/min (ref >60)
GLUCOSE: 155 mg/dL — AB (ref 65–99)
POTASSIUM: 3.6 mmol/L (ref 3.5–5.1)
SODIUM: 140 mmol/L (ref 135–145)
TOTAL PROTEIN: 8 g/dL (ref 6.5–8.1)

## 2017-03-24 LAB — URINALYSIS, MICROSCOPIC (REFLEX): WBC, UA: NONE SEEN WBC/hpf (ref 0–5)

## 2017-03-24 LAB — CBC
HCT: 48.8 % (ref 39.0–52.0)
Hemoglobin: 17.3 g/dL — ABNORMAL HIGH (ref 13.0–17.0)
MCH: 30.2 pg (ref 26.0–34.0)
MCHC: 35.5 g/dL (ref 30.0–36.0)
MCV: 85.3 fL (ref 78.0–100.0)
Platelets: 164 K/uL (ref 150–400)
RBC: 5.72 MIL/uL (ref 4.22–5.81)
RDW: 14.4 % (ref 11.5–15.5)
WBC: 14.8 K/uL — ABNORMAL HIGH (ref 4.0–10.5)

## 2017-03-24 LAB — URINALYSIS, ROUTINE W REFLEX MICROSCOPIC
Bilirubin Urine: NEGATIVE
Glucose, UA: NEGATIVE mg/dL
Ketones, ur: NEGATIVE mg/dL
Leukocytes, UA: NEGATIVE
Nitrite: NEGATIVE
Protein, ur: NEGATIVE mg/dL
Specific Gravity, Urine: 1.025 (ref 1.005–1.030)
pH: 6 (ref 5.0–8.0)

## 2017-03-24 LAB — LIPASE, BLOOD: Lipase: 29 U/L (ref 11–51)

## 2017-03-24 MED ORDER — SODIUM CHLORIDE 0.9 % IV BOLUS (SEPSIS)
1000.0000 mL | Freq: Once | INTRAVENOUS | Status: AC
  Administered 2017-03-24: 1000 mL via INTRAVENOUS

## 2017-03-24 MED ORDER — ONDANSETRON 4 MG PO TBDP: 4.0000 mg | ORAL_TABLET | Freq: Four times a day (QID) | ORAL | Status: DC | PRN

## 2017-03-24 MED ORDER — ONDANSETRON HCL 4 MG/2ML IJ SOLN
4.0000 mg | Freq: Once | INTRAMUSCULAR | Status: AC
  Administered 2017-03-24: 4 mg via INTRAVENOUS
  Filled 2017-03-24: qty 2

## 2017-03-24 MED ORDER — KCL IN DEXTROSE-NACL 20-5-0.45 MEQ/L-%-% IV SOLN
INTRAVENOUS | Status: DC
  Administered 2017-03-24: 22:00:00 via INTRAVENOUS
  Filled 2017-03-24 (×3): qty 1000

## 2017-03-24 MED ORDER — MORPHINE SULFATE (PF) 2 MG/ML IV SOLN
1.0000 mg | INTRAVENOUS | Status: DC | PRN
  Administered 2017-03-24: 2 mg via INTRAVENOUS
  Filled 2017-03-24: qty 1

## 2017-03-24 MED ORDER — DEXTROSE 5 % IV SOLN
2.0000 g | Freq: Once | INTRAVENOUS | Status: AC
  Administered 2017-03-24: 2 g via INTRAVENOUS
  Filled 2017-03-24: qty 2

## 2017-03-24 MED ORDER — HYDROCODONE-ACETAMINOPHEN 5-325 MG PO TABS: 1.0000 | ORAL_TABLET | ORAL | Status: DC | PRN

## 2017-03-24 MED ORDER — ONDANSETRON HCL 4 MG/2ML IJ SOLN: 4.0000 mg | Freq: Four times a day (QID) | INTRAMUSCULAR | Status: DC | PRN

## 2017-03-24 MED ORDER — DEXTROSE 5 % IV SOLN
2.0000 g | INTRAVENOUS | Status: DC
  Filled 2017-03-24: qty 2

## 2017-03-24 MED ORDER — MORPHINE SULFATE (PF) 4 MG/ML IV SOLN
4.0000 mg | Freq: Once | INTRAVENOUS | Status: AC
  Administered 2017-03-24: 4 mg via INTRAVENOUS
  Filled 2017-03-24: qty 1

## 2017-03-24 NOTE — ED Notes (Signed)
Patient transported to Ultrasound 

## 2017-03-24 NOTE — ED Triage Notes (Signed)
Upper abd pain with N/V since 3 this morning. Denies diarrhea.

## 2017-03-24 NOTE — ED Notes (Signed)
ED Provider at bedside. 

## 2017-03-24 NOTE — ED Provider Notes (Signed)
[facility] Provider Note   CSN: [id number] Arrival date & time: 03/24/17  1357     History   Chief Complaint Chief Complaint  Patient presents with  . Abdominal Pain    HPI Bob Burton is a 62 y.o. male.  The history is provided by the patient.  Abdominal Pain   This is a new problem. Episode onset: started at 3am this morning when it woke him up. The problem occurs constantly. The problem has been gradually worsening. The pain is associated with an unknown factor. The pain is located in the RUQ and epigastric region. The quality of the pain is colicky, shooting and pressure-like (feels like there is a belt around his upper abd). The pain is at a severity of 9/10. The pain is severe. Associated symptoms include anorexia, flatus, nausea and vomiting. Pertinent negatives include fever, diarrhea, melena, constipation, dysuria and frequency. Nothing aggravates the symptoms. Nothing relieves the symptoms. Past medical history comments: no prior abd surgeries and no prior hx of same.    Past Medical History:  Diagnosis Date  . Abnormal liver enzymes   . Dyslipidemia   . Elevated PSA   . Hyperlipemia   . Hypertension   . Hypothyroidism   . Vitamin D deficiency     Patient Active Problem List   Diagnosis Date Noted  . Hypothyroidism 12/21/2015  . Dyslipidemia 12/21/2015  . Obstructive sleep apnea 12/21/2015  . Low back pain 12/21/2015  . Dermatophytosis 12/21/2015  . Gout 12/21/2015  . Elevated liver enzymes 12/21/2015  . Elevated PSA 12/21/2015  . Hereditary hemochromatosis ([place]) 12/21/2015  . Abnormal liver function 12/21/2015    History reviewed. No pertinent surgical history.     Home Medications    Prior to Admission medications   Medication Sig Start Date End Date Taking? Authorizing Provider  levothyroxine (SYNTHROID, LEVOTHROID) 150 MCG tablet Take 150 mcg by mouth daily before breakfast.    Yes [provider]  lisinopril-hydrochlorothiazide (PRINZIDE,ZESTORETIC) 10-12.5 MG tablet Take 1 tablet by mouth every morning.  11/04/15  Yes [provider]    Family History No family history on file.  Social History Social History   Tobacco Use  . Smoking status: Never Smoker  . Smokeless tobacco: Never Used  Substance Use Topics  . Alcohol use: No  . Drug use: No     Allergies   Patient has no known allergies.   Review of Systems Review of Systems  Constitutional: Negative for fever.  Gastrointestinal: Positive for abdominal pain, anorexia, flatus, nausea and vomiting. Negative for constipation, diarrhea and melena.  Genitourinary: Negative for dysuria and frequency.  All other systems reviewed and are negative.    Physical Exam Updated Vital Signs BP (!) 148/98 (BP Location: Right Arm)   Pulse 71   Temp 99 F (37.2 C) (Oral)   Resp 18   Ht 5' 11"  (1.803 m)   Wt 117.9 kg (260 lb)   SpO2 100%   BMI 36.26 kg/m   Physical Exam  Constitutional: He is oriented to person, place, and time. He appears well-developed and well-nourished. No distress.  HENT:  Head: Normocephalic and atraumatic.  Mouth/Throat: Oropharynx is clear and moist.  Eyes: Conjunctivae and EOM are normal. Pupils are equal, round, and reactive to light.  Neck: Normal range of motion. Neck supple.  Cardiovascular: Normal rate, regular rhythm and intact distal pulses.  No murmur heard. Pulmonary/Chest: Effort normal and breath sounds normal. No respiratory distress. He has  no wheezes. He has no rales.  Abdominal: Soft. He exhibits no distension. There is tenderness in the right upper quadrant and epigastric area. There is positive Murphy's sign. There is no rebound, no guarding and no CVA tenderness.  Musculoskeletal: Normal range of motion. He exhibits no edema or tenderness.  Neurological: He is alert and oriented to person, place, and time.  Skin: Skin is warm and dry. No rash noted. No erythema.    Psychiatric: He has a normal mood and affect. His behavior is normal.  Nursing note and vitals reviewed.    ED Treatments / Results  Labs (all labs ordered are listed, but only abnormal results are displayed) Labs Reviewed  COMPREHENSIVE METABOLIC PANEL - Abnormal; Notable for the following components:      Result Value   CO2 21 (*)    Glucose, Bld 155 (*)    BUN 23 (*)    AST 46 (*)    ALT 75 (*)    All other components within normal limits  CBC - Abnormal; Notable for the following components:   WBC 14.8 (*)    Hemoglobin 17.3 (*)    All other components within normal limits  URINALYSIS, ROUTINE W REFLEX MICROSCOPIC - Abnormal; Notable for the following components:   Hgb urine dipstick TRACE (*)    All other components within normal limits  URINALYSIS, MICROSCOPIC (REFLEX) - Abnormal; Notable for the following components:   Bacteria, UA FEW (*)    Squamous Epithelial / LPF 0-5 (*)    All other components within normal limits  LIPASE, BLOOD    EKG  EKG Interpretation None       Radiology US Abdomen Limited Ruq  Result Date: 03/24/2017 CLINICAL DATA:  Severe abdominal pain. Nausea and vomiting. C/o severe upper abd pain and nausea today. Hx gallstones, HTN, HLD, fatty liver, liver bx, hemochromatosis Positive Murphy's sign EXAM: ULTRASOUND ABDOMEN LIMITED RIGHT UPPER QUADRANT COMPARISON:  Ultrasound 02/02/2016 FINDINGS: Gallbladder: Positive sonographic Murphy's sign. The gallbladder is nondistended and the gallbladder wall is not thickened. No pericholecystic fluid. There are gallstones present and sludge. Large gallstone measures 1.7 cm. Common bile duct: Diameter: Upper limits normal at 5 mm. Liver: No focal lesion. Liver increased in echogenicity similar prior portal vein is patent on color Doppler imaging with normal direction of blood flow towards the liver. IMPRESSION: 1. Sludge and stones within the gallbladder and positive sonographic Murphy's sign. No  pericholecystic fluid or wall thickening. Recommend clinical and laboratory correlation for acute cholecystitis. 2. Echogenic liver similar prior. Electronically Signed   By: Suzy Bouchard M.D.   On: 03/24/2017 16:22    Procedures Procedures (including critical care time)  Medications Ordered in ED Medications  cefTRIAXone (ROCEPHIN) 2 g in dextrose 5 % 50 mL IVPB (not administered)  morphine 4 MG/ML injection 4 mg (4 mg Intravenous Given 03/24/17 1557)  ondansetron (ZOFRAN) injection 4 mg (4 mg Intravenous Given 03/24/17 1557)  sodium chloride 0.9 % bolus 1,000 mL (0 mLs Intravenous Stopped 03/24/17 1650)     Initial Impression / Assessment and Plan / ED Course  I have reviewed the triage vital signs and the nursing notes.  Pertinent labs & imaging results that were available during my care of the patient were reviewed by me and considered in my medical decision making (see chart for details).     Patient is a relatively healthy 62 year old male presenting today with symptoms most consistent with acute cholecystitis.  Also possibility would be gastritis, perforated ulcer,  pancreatitis, bowel obstruction.  Lower suspicion for appendicitis or diverticulitis as patient does not have significant lower abdominal pain.  He also denies any urinary complaints concerning for kidney stone, prostatitis or UTI.  Patient also denies any chest pain or shortness of breath concerning for MI or lung pathology.  Patient CBC shows a leukocytosis of 14,000, CMP with mildly elevated LFTs which are within his normal range due to nonalcoholic liver disease and normal lipase.  Right upper quadrant ultrasound pending.  Patient given IV fluids, Zofran and pain control.  5:04 PM RUQ [place] with gallstones and a positive sonographic Murphy sign.  No wall thickening or pericholecystic fluid.  Spoke with Dr. Lucia Gaskins who request the patient be transferred to [facility] ED for further evaluation.  Patient made n.p.o.  Patient  given antibiotics.  Final Clinical Impressions(s) / ED Diagnoses   Final diagnoses:  RUQ abdominal pain  Acute cholecystitis    ED Discharge Orders    None       Blanchie Dessert, MD 03/24/17 1706

## 2017-03-24 NOTE — ED Notes (Signed)
Pt given ice chips. OK per Dr. Anitra LauthPlunkett.

## 2017-03-24 NOTE — H&P (Signed)
Re:   Bob Burton DOB:   1955/05/09 MRN:   [id number]   [facility] Admission  Chief Complaint Abdominal pain  ASSESEMENT AND PLAN: 1.  Cholecystitis, cholelithiasis  I discussed with the patient the indications and risks of gall bladder surgery.  The primary risks of gall bladder surgery include, but are not limited to, bleeding, infection, common bile duct injury, and open surgery.  There is also the risk that the patient may have continued symptoms after surgery.  We discussed the typical post-operative recovery course. I tried to answer the patient's questions.  I gave the patient literature about gall bladder surgery.  2.  Hyperlipidemia 3.  HTN 4.  Thyroid replacement 5.  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 Carolyne Fiscal 6.  Gout 7.  Steatohepatitis - on liver biopsy 01/2016  Chief Complaint  Patient presents with  . Abdominal Pain   PHYSICIAN REQUESTING CONSULTATION:  Dr. Blanchie Dessert, [facility]  HISTORY OF PRESENT ILLNESS: Bob Burton is a 62 y.o. (DOB: [birth year])  white male whose primary care physician is Aura Dials, MD. His wife, Chrissie, is in the room with the patient.   He has had no chronic abdominal complaints.  He awoke at 3 AM this morning and had epigastric abdominal pain and started vomiting.  He vomited hourly until 11 AM.  His vomiting got better, but he had epigastric pain and went to the [facility] for evaluation.  He is not febrile.  He has no jaundice.  No one in his familty had gall bladder disease.  He has had no prior abdominal surgery.  He had a colonoscopy about 5+ years ago.  He is unsure who did the colonoscopy.  He is followed by Dr. Irene Limbo for hemochromatosis and gets q 6 month phlebotomies.  He also has a fatty liver.  Because of persistent epigastric pain, Dr. Maryan Rued called me to see the patient.    [place] of abdomen - 03/24/2017 - 1. Sludge and stones  within the gallbladder and positive sonographic Murphy's sign. No pericholecystic fluid or wall thickening.   2. Echogenic liver similar prior. WBC - 14,800 - 03/24/2017 Mildly elevated AST and ALT    Past Medical History:  Diagnosis Date  . Abnormal liver enzymes   . Dyslipidemia   . Elevated PSA   . Hyperlipemia   . Hypertension   . Hypothyroidism   . Vitamin D deficiency      History reviewed. No pertinent surgical history.    No current facility-administered medications for this encounter.      No Known Allergies  REVIEW OF SYSTEMS: Skin:  No history of rash.  No history of abnormal moles. Infection:  No history of hepatitis or HIV.  No history of MRSA. Neurologic:  No history of stroke.  No history of seizure.  No history of headaches. Cardiac:  HTN.  No history of heart disease.  No history of prior cardiac catheterization.  No history of seeing a cardiologist. Pulmonary:  Sleep apnea.  Endocrine:  No diabetes. Thyroid replacement. Gastrointestinal:  See HPI Urologic:  No history of kidney stones.  No history of bladder infections. Musculoskeletal:  No history of joint or back disease. Hematologic:  Hemochromatosis Psycho-social:  The patient is oriented.   The patient has no obvious psychologic or social impairment to understanding our conversation and plan.  SOCIAL and FAMILY HISTORY: His wife, Chrissie, is in the room  with the patient. He has 3 children:  18,15, and 27 yo He works for [employer]  No family history of gall bladder disease.  PHYSICAL EXAM: BP 134/78 (BP Location: Left Arm)   Pulse 72   Temp 99.5 F (37.5 C) (Oral)   Resp 20   Ht 5' 11"  (1.803 m)   Wt 117.9 kg (260 lb)   SpO2 95%   BMI 36.26 kg/m   General: Mildly obese WM who is alert and generally healthy appearing.  Skin:  Inspection and palpation - no mass or rash. Eyes:  Conjunctiva and lids unremarkable.            Pupils are equal Ears, Nose, Mouth, and  Throat:  Ears and nose unremarkable            Lips and teeth are unremarable. Neck: Supple. No mass, trachea midline.  No thyroid mass. Lymph Nodes:  No supraclavicular, cervical, or inguinal nodes. Lungs: Normal respiratory effort.  Clear to auscultation and symmetric breath sounds. Heart:  Palpation of the heart is normal.            Auscultation: RRR. No murmur or rub.  Abdomen: Soft. No mass.  No hernia. Few bowel sounds.  No abdominal scars.  Epigastric tenderness without rebound. Rectal: Not done. Musculoskeletal:  Good muscle strength and ROM  in upper and lower extremities. Neurologic:  Grossly intact to motor and sensory function. Psychiatric: Normal judgement and insight. Behavior is normal.            Oriented to time, person, place.   DATA REVIEWED, COUNSELING AND COORDINATION OF CARE: Epic notes reviewed. Counseling and coordination of care exceeded more than 50% of the time spent with patient. Total time spent with patient and charting: 45 minutes.  Alphonsa Overall, MD,  [facility], [address]  [place], [place]    [place] Phone:  [phone] FAX:  [phone]

## 2017-03-24 NOTE — ED Notes (Addendum)
Given warm blankets. Placed on continuous pulse ox. VS q15 min.

## 2017-03-25 ENCOUNTER — Inpatient Hospital Stay (HOSPITAL_COMMUNITY): Payer: Commercial Managed Care - PPO

## 2017-03-25 ENCOUNTER — Encounter (HOSPITAL_COMMUNITY)
Admission: EM | Disposition: A | Discharge status: Home / Self Care | Payer: Self-pay | Source: Home / Self Care | Attending: Surgery

## 2017-03-25 ENCOUNTER — Inpatient Hospital Stay (HOSPITAL_COMMUNITY): Payer: Commercial Managed Care - PPO | Admitting: Certified Registered"

## 2017-03-25 ENCOUNTER — Encounter (HOSPITAL_COMMUNITY): Payer: Self-pay | Admitting: Certified Registered"

## 2017-03-25 HISTORY — PX: CHOLECYSTECTOMY: SHX55

## 2017-03-25 LAB — SURGICAL PCR SCREEN
MRSA, PCR: POSITIVE — AB
Staphylococcus aureus: POSITIVE — AB

## 2017-03-25 SURGERY — LAPAROSCOPIC CHOLECYSTECTOMY WITH INTRAOPERATIVE CHOLANGIOGRAM
Anesthesia: General | Site: Abdomen

## 2017-03-25 MED ORDER — SUGAMMADEX SODIUM 500 MG/5ML IV SOLN
INTRAVENOUS | Status: AC
  Filled 2017-03-25: qty 10

## 2017-03-25 MED ORDER — PHENYLEPHRINE 40 MCG/ML (10ML) SYRINGE FOR IV PUSH (FOR BLOOD PRESSURE SUPPORT)
PREFILLED_SYRINGE | INTRAVENOUS | Status: AC
  Filled 2017-03-25: qty 20

## 2017-03-25 MED ORDER — SUGAMMADEX SODIUM 500 MG/5ML IV SOLN
INTRAVENOUS | Status: DC | PRN
  Administered 2017-03-25: 300 mg via INTRAVENOUS

## 2017-03-25 MED ORDER — LIDOCAINE 2% (20 MG/ML) 5 ML SYRINGE
INTRAMUSCULAR | Status: DC | PRN
  Administered 2017-03-25: 100 mg via INTRAVENOUS

## 2017-03-25 MED ORDER — FENTANYL CITRATE (PF) 250 MCG/5ML IJ SOLN
INTRAMUSCULAR | Status: DC | PRN
  Administered 2017-03-25: 100 ug via INTRAVENOUS
  Administered 2017-03-25 (×2): 25 ug via INTRAVENOUS
  Administered 2017-03-25: 50 ug via INTRAVENOUS

## 2017-03-25 MED ORDER — KCL IN DEXTROSE-NACL 20-5-0.45 MEQ/L-%-% IV SOLN
INTRAVENOUS | Status: DC
  Administered 2017-03-25: 17:00:00 via INTRAVENOUS
  Filled 2017-03-25 (×2): qty 1000

## 2017-03-25 MED ORDER — PHENYLEPHRINE 40 MCG/ML (10ML) SYRINGE FOR IV PUSH (FOR BLOOD PRESSURE SUPPORT)
PREFILLED_SYRINGE | INTRAVENOUS | Status: DC | PRN
  Administered 2017-03-25: 80 ug via INTRAVENOUS
  Administered 2017-03-25 (×2): 120 ug via INTRAVENOUS
  Administered 2017-03-25: 200 ug via INTRAVENOUS
  Administered 2017-03-25: 120 ug via INTRAVENOUS
  Administered 2017-03-25: 200 ug via INTRAVENOUS
  Administered 2017-03-25: 120 ug via INTRAVENOUS
  Administered 2017-03-25 (×2): 80 ug via INTRAVENOUS

## 2017-03-25 MED ORDER — MIDAZOLAM HCL 2 MG/2ML IJ SOLN
INTRAMUSCULAR | Status: AC
  Filled 2017-03-25: qty 2

## 2017-03-25 MED ORDER — SUCCINYLCHOLINE CHLORIDE 200 MG/10ML IV SOSY
PREFILLED_SYRINGE | INTRAVENOUS | Status: DC | PRN
  Administered 2017-03-25: 120 mg via INTRAVENOUS

## 2017-03-25 MED ORDER — 0.9 % SODIUM CHLORIDE (POUR BTL) OPTIME
TOPICAL | Status: DC | PRN
  Administered 2017-03-25: 1000 mL

## 2017-03-25 MED ORDER — SODIUM CHLORIDE 0.9 % IV SOLN
INTRAVENOUS | Status: DC | PRN
  Administered 2017-03-25: 5 mL

## 2017-03-25 MED ORDER — SUGAMMADEX SODIUM 200 MG/2ML IV SOLN
INTRAVENOUS | Status: AC
  Filled 2017-03-25: qty 2

## 2017-03-25 MED ORDER — SUCCINYLCHOLINE CHLORIDE 200 MG/10ML IV SOSY
PREFILLED_SYRINGE | INTRAVENOUS | Status: AC
  Filled 2017-03-25: qty 20

## 2017-03-25 MED ORDER — DEXAMETHASONE SODIUM PHOSPHATE 10 MG/ML IJ SOLN
INTRAMUSCULAR | Status: AC
  Filled 2017-03-25: qty 2

## 2017-03-25 MED ORDER — LACTATED RINGERS IV SOLN
INTRAVENOUS | Status: DC | PRN
  Administered 2017-03-25 (×2): via INTRAVENOUS

## 2017-03-25 MED ORDER — ONDANSETRON HCL 4 MG/2ML IJ SOLN
4.0000 mg | Freq: Once | INTRAMUSCULAR | Status: AC
  Administered 2017-03-25: 4 mg via INTRAVENOUS
  Filled 2017-03-25: qty 2

## 2017-03-25 MED ORDER — FENTANYL CITRATE (PF) 250 MCG/5ML IJ SOLN
INTRAMUSCULAR | Status: AC
  Filled 2017-03-25: qty 5

## 2017-03-25 MED ORDER — HYDROCODONE-ACETAMINOPHEN 5-325 MG PO TABS
1.0000 | ORAL_TABLET | ORAL | Status: DC | PRN
  Administered 2017-03-25 – 2017-03-26 (×2): 1 via ORAL
  Filled 2017-03-25 (×2): qty 1

## 2017-03-25 MED ORDER — CHLORHEXIDINE GLUCONATE CLOTH 2 % EX PADS
6.0000 | MEDICATED_PAD | Freq: Every day | CUTANEOUS | Status: DC
  Administered 2017-03-25: 6 via TOPICAL

## 2017-03-25 MED ORDER — ONDANSETRON HCL 4 MG/2ML IJ SOLN
INTRAMUSCULAR | Status: DC | PRN
  Administered 2017-03-25: 4 mg via INTRAVENOUS

## 2017-03-25 MED ORDER — HYDROMORPHONE HCL 1 MG/ML IJ SOLN: 0.2500 mg | INTRAMUSCULAR | Status: DC | PRN

## 2017-03-25 MED ORDER — PROPOFOL 10 MG/ML IV BOLUS
INTRAVENOUS | Status: DC | PRN
  Administered 2017-03-25: 200 mg via INTRAVENOUS

## 2017-03-25 MED ORDER — BUPIVACAINE-EPINEPHRINE (PF) 0.5% -1:200000 IJ SOLN
INTRAMUSCULAR | Status: DC | PRN
  Administered 2017-03-25: 30 mL

## 2017-03-25 MED ORDER — SUGAMMADEX SODIUM 500 MG/5ML IV SOLN
INTRAVENOUS | Status: AC
Start: 2017-03-25 — End: 2017-03-25
  Filled 2017-03-25: qty 5

## 2017-03-25 MED ORDER — ROCURONIUM BROMIDE 10 MG/ML (PF) SYRINGE
PREFILLED_SYRINGE | INTRAVENOUS | Status: DC | PRN
  Administered 2017-03-25: 40 mg via INTRAVENOUS
  Administered 2017-03-25 (×2): 10 mg via INTRAVENOUS

## 2017-03-25 MED ORDER — ROCURONIUM BROMIDE 50 MG/5ML IV SOSY
PREFILLED_SYRINGE | INTRAVENOUS | Status: AC
  Filled 2017-03-25: qty 10

## 2017-03-25 MED ORDER — ONDANSETRON HCL 4 MG/2ML IJ SOLN
INTRAMUSCULAR | Status: AC
  Filled 2017-03-25: qty 4

## 2017-03-25 MED ORDER — MORPHINE SULFATE (PF) 2 MG/ML IV SOLN: 1.0000 mg | INTRAVENOUS | Status: DC | PRN

## 2017-03-25 MED ORDER — MUPIROCIN 2 % EX OINT
1.0000 "application " | TOPICAL_OINTMENT | Freq: Two times a day (BID) | CUTANEOUS | Status: DC
  Administered 2017-03-25 – 2017-03-26 (×4): 1 via NASAL
  Filled 2017-03-25: qty 22

## 2017-03-25 MED ORDER — MIDAZOLAM HCL 2 MG/2ML IJ SOLN
INTRAMUSCULAR | Status: DC | PRN
  Administered 2017-03-25: 2 mg via INTRAVENOUS

## 2017-03-25 MED ORDER — LACTATED RINGERS IR SOLN
Status: DC | PRN
  Administered 2017-03-25: 3000 mL

## 2017-03-25 MED ORDER — ROCURONIUM BROMIDE 50 MG/5ML IV SOSY
PREFILLED_SYRINGE | INTRAVENOUS | Status: AC
  Filled 2017-03-25: qty 5

## 2017-03-25 MED ORDER — IOPAMIDOL (ISOVUE-300) INJECTION 61%
INTRAVENOUS | Status: AC
  Filled 2017-03-25: qty 50

## 2017-03-25 MED ORDER — PHENYLEPHRINE HCL 10 MG/ML IJ SOLN
INTRAVENOUS | Status: DC | PRN
  Administered 2017-03-25: 75 ug/min via INTRAVENOUS

## 2017-03-25 MED ORDER — BUPIVACAINE-EPINEPHRINE (PF) 0.5% -1:200000 IJ SOLN
INTRAMUSCULAR | Status: AC
  Filled 2017-03-25: qty 30

## 2017-03-25 MED ORDER — DEXTROSE 5 % IV SOLN
2.0000 g | INTRAVENOUS | Status: DC
  Administered 2017-03-25: 2 g via INTRAVENOUS
  Filled 2017-03-25 (×2): qty 2

## 2017-03-25 MED ORDER — LIDOCAINE 2% (20 MG/ML) 5 ML SYRINGE
INTRAMUSCULAR | Status: AC
  Filled 2017-03-25: qty 10

## 2017-03-25 MED ORDER — DEXAMETHASONE SODIUM PHOSPHATE 10 MG/ML IJ SOLN
INTRAMUSCULAR | Status: DC | PRN
  Administered 2017-03-25: 10 mg via INTRAVENOUS

## 2017-03-25 MED ORDER — BUPIVACAINE HCL (PF) 0.25 % IJ SOLN
INTRAMUSCULAR | Status: AC
  Filled 2017-03-25: qty 30

## 2017-03-25 MED ORDER — PROPOFOL 10 MG/ML IV BOLUS
INTRAVENOUS | Status: AC
  Filled 2017-03-25: qty 20

## 2017-03-25 SURGICAL SUPPLY — 38 items
APPLIER CLIP 5 13 M/L LIGAMAX5 (MISCELLANEOUS)
APPLIER CLIP ROT 10 11.4 M/L (STAPLE) ×3
BENZOIN TINCTURE PRP APPL 2/3 (GAUZE/BANDAGES/DRESSINGS) IMPLANT
CABLE HIGH FREQUENCY MONO STRZ (ELECTRODE) ×3 IMPLANT
CHLORAPREP W/TINT 26ML (MISCELLANEOUS) ×3 IMPLANT
CHOLANGIOGRAM CATH TAUT (CATHETERS) ×3 IMPLANT
CLIP APPLIE 5 13 M/L LIGAMAX5 (MISCELLANEOUS) IMPLANT
CLIP APPLIE ROT 10 11.4 M/L (STAPLE) ×1 IMPLANT
CLOSURE WOUND 1/4X4 (GAUZE/BANDAGES/DRESSINGS)
COVER MAYO STAND STRL (DRAPES) ×3 IMPLANT
COVER SURGICAL LIGHT HANDLE (MISCELLANEOUS) ×3 IMPLANT
DECANTER SPIKE VIAL GLASS SM (MISCELLANEOUS) ×3 IMPLANT
DERMABOND ADVANCED (GAUZE/BANDAGES/DRESSINGS) ×2
DERMABOND ADVANCED .7 DNX12 (GAUZE/BANDAGES/DRESSINGS) ×1 IMPLANT
DRAPE C-ARM 42X120 X-RAY (DRAPES) ×3 IMPLANT
ELECT REM PT RETURN 15FT ADLT (MISCELLANEOUS) ×3 IMPLANT
GLOVE SURG SIGNA 7.5 PF LTX (GLOVE) ×3 IMPLANT
GOWN STRL REUS W/TWL XL LVL3 (GOWN DISPOSABLE) ×9 IMPLANT
HEMOSTAT SURGICEL 4X8 (HEMOSTASIS) IMPLANT
IV CATH 14GX2 1/4 (CATHETERS) ×3 IMPLANT
IV SET EXTENSION CATH 6 NF (IV SETS) ×3 IMPLANT
KIT BASIN OR (CUSTOM PROCEDURE TRAY) ×3 IMPLANT
POUCH RETRIEVAL ECOSAC 10 (ENDOMECHANICALS) ×1 IMPLANT
POUCH RETRIEVAL ECOSAC 10MM (ENDOMECHANICALS) ×2
SCISSORS METZENBAUM CVD 33 (INSTRUMENTS) ×3 IMPLANT
SET TUBE IRRIG SUCTION NO TIP (IRRIGATION / IRRIGATOR) ×3 IMPLANT
SLEEVE ADV FIXATION 5X100MM (TROCAR) ×3 IMPLANT
STOPCOCK 4 WAY LG BORE MALE ST (IV SETS) ×3 IMPLANT
STRIP CLOSURE SKIN 1/4X4 (GAUZE/BANDAGES/DRESSINGS) IMPLANT
SUT MNCRL AB 4-0 PS2 18 (SUTURE) ×3 IMPLANT
SYR 10ML ECCENTRIC (SYRINGE) ×3 IMPLANT
TOWEL OR 17X26 10 PK STRL BLUE (TOWEL DISPOSABLE) ×3 IMPLANT
TOWEL OR NON WOVEN STRL DISP B (DISPOSABLE) ×3 IMPLANT
TRAY LAPAROSCOPIC (CUSTOM PROCEDURE TRAY) ×3 IMPLANT
TROCAR ADV FIXATION 11X100MM (TROCAR) ×3 IMPLANT
TROCAR ADV FIXATION 5X100MM (TROCAR) ×3 IMPLANT
TROCAR XCEL BLUNT TIP 100MML (ENDOMECHANICALS) ×3 IMPLANT
TUBING INSUF HEATED (TUBING) ×3 IMPLANT

## 2017-03-25 NOTE — Op Note (Signed)
03/25/2017  1:42 PM  PATIENT:  Bob Burton, 62 y.o., male, MRN: [id number]  PREOP DIAGNOSIS:  cholecystitis  POSTOP DIAGNOSIS:   Acute hemorrhagic cholecystitis, cholelithiasis, impacted stone in cystic duct, mild sclerosis of liver  PROCEDURE:   Procedure(s):  LAPAROSCOPIC CHOLECYSTECTOMY WITH INTRAOPERATIVE CHOLANGIOGRAM  SURGEON:   Alphonsa Overall, M.D.  ASSISTANT:   None  ANESTHESIA:   general  Anesthesiologist: Belinda Block, MD CRNA: Cynda Familia, CRNA  General  ASA: 3E  EBL:  minimal  ml  BLOOD ADMINISTERED: none  DRAINS: none   LOCAL MEDICATIONS USED:   30 cc 1/2% marcaine  SPECIMEN:   Gall bladder  COUNTS CORRECT:  YES  INDICATIONS FOR PROCEDURE:  Bob Burton is a 62 y.o. (DOB: 1955/03/22) white male whose primary care physician is Aura Dials, MD and comes for cholecystectomy.   The indications and risks of the gall bladder surgery were explained to the patient.  The risks include, but are not limited to, infection, bleeding, common bile duct injury and open surgery.  SURGERY:  The patient was taken to OR room #[facility] at [facility].  The abdomen was prepped with chloroprep.  The patient was already on Rocephin at the beginning of the operation.   A time out was held and the surgical checklist run.   An infraumbilical incision was made into the abdominal cavity.  A 12 mm Hasson trocar was inserted into the abdominal cavity through the infraumbilical incision and secured with a 0 Vicryl suture.  Three additional trocars were inserted: a 10 mm trocar in the sub-xiphoid location, a 5 mm trocar in the right mid subcostal area, and a 5 mm trocar in the right lateral subcostal area.   The abdomen was explored and the stomach and bowel that could be seen were unremarkable.  The liver was mildly sclerotic/fibrous.   The gall bladder was acutely inflamed and hemorrhagic.  First, I decompressed the gall bladder and there was white bile.   I  grasped the gall bladder and rotated it cephalad.  Disssection was carried down to the gall bladder/cystic duct junction and the cystic duct isolated. There was a lot of edema around the gall bladder/cystic duct junction.  The cystic duct was opened and he had a stone impacted in the cystic duct.   This was removed.  A clip was placed on the gall bladder side of the cystic duct.   An intra-operative cholangiogram was shot.   The intra-operative cholangiogram was shot using a cut off Taut catheter placed through a 14 gauge angiocath in the RUQ.  The Taut catheter was inserted in the cut cystic duct and secured with an endoclip.  A cholangiogram was shot with 6 cc of 1/2 strength Isoview.  Using fluoroscopy, the cholangiogram showed the flow of contrast into the common bile duct, up the hepatic radicals, and into the duodenum.  There was no mass or obstruction.  This was a normal intra-operative cholangiogram.   The Taut catheter was removed.  The cystic duct was tripley endoclipped and the cystic artery was identified and clipped.  The gall bladder was bluntly and sharpley dissected from the gall bladder bed.  There was some bleeding in the gall bladder bed that was controlled with cautery.  I packed the gall bladder bed with Surgicel.   After the gall bladder was removed from the liver, the gall bladder bed and [place] were inspected.  There was no bleeding or bile leak.  The gall  bladder was placed in a endocatch bag and delivered through the umbilicus.  The abdomen was irrigated with 2,000 cc saline.   The trocars were then removed.  I infiltrated 30 cc of 1/4% Marcaine into the incisions.  The umbilical port closed with a 0 Vicryl suture and the skin closed with 4-0 Monocryl.  The skin was painted with DermaBond.  The patient's sponge and needle count were correct.  The patient was transported to the [facility] in good condition.  Alphonsa Overall, MD, [facility] Pager:  [phone] Office phone:  [phone]

## 2017-03-25 NOTE — Anesthesia Postprocedure Evaluation (Signed)
Anesthesia Post Note  Patient: Bob EaglesJukka-Petri E Burton  Procedure(s) Performed: LAPAROSCOPIC CHOLECYSTECTOMY WITH INTRAOPERATIVE CHOLANGIOGRAM (N/A Abdomen)     Patient location during evaluation: PACU Anesthesia Type: General Level of consciousness: awake Pain management: pain level controlled Vital Signs Assessment: post-procedure vital signs reviewed and stable Respiratory status: spontaneous breathing Cardiovascular status: stable Anesthetic complications: no    Last Vitals:  Vitals:   03/25/17 1353 03/25/17 1400  BP: (!) 135/95 (!) 139/92  Pulse: 89 88  Resp: 18 18  Temp: 37.5 C   SpO2: 99% 98%    Last Pain:  Vitals:   03/25/17 1031  TempSrc: Oral  PainSc:                  [name]

## 2017-03-25 NOTE — Transfer of Care (Signed)
Immediate Anesthesia Transfer of Care Note  Patient: Bob Burton  Procedure(s) Performed: LAPAROSCOPIC CHOLECYSTECTOMY WITH INTRAOPERATIVE CHOLANGIOGRAM (N/A Abdomen)  Patient Location: PACU  Anesthesia Type:General  Level of Consciousness: awake and alert   Airway & Oxygen Therapy: Patient Spontanous Breathing and Patient connected to face mask oxygen  Post-op Assessment: Report given to RN and Post -op Vital signs reviewed and stable  Post vital signs: Reviewed and stable  Last Vitals:  Vitals:   03/25/17 1605 03/25/17 1703  BP: (!) 151/93 (!) 148/90  Pulse: 73 78  Resp: 16 16  Temp: 37.2 C 37.1 C  SpO2: 94% 95%    Last Pain:  Vitals:   03/25/17 1703  TempSrc: Oral  PainSc:       Patients Stated Pain Goal: 2 (03/24/17 2134)  Complications: No apparent anesthesia complications

## 2017-03-25 NOTE — Anesthesia Preprocedure Evaluation (Addendum)
Anesthesia Evaluation  Patient identified by MRN, date of birth, ID band Patient awake    Reviewed: Allergy & Precautions, NPO status , Patient's Chart, lab work & pertinent test results  Airway Mallampati: II       Dental  (+) Dental Advisory Given   Pulmonary sleep apnea ,    breath sounds clear to auscultation       Cardiovascular hypertension,  Rhythm:Regular Rate:Normal     Neuro/Psych    GI/Hepatic Neg liver ROS, History noted. CG   Endo/Other  Hypothyroidism   Renal/GU negative Renal ROS     Musculoskeletal   Abdominal   Peds  Hematology   Anesthesia Other Findings   Reproductive/Obstetrics                           Anesthesia Physical Anesthesia Plan  ASA: III  Anesthesia Plan: General   Post-op Pain Management:    Induction: Intravenous, Rapid sequence and Cricoid pressure planned  PONV Risk Score and Plan: 2 and Treatment may vary due to age or medical condition  Airway Management Planned: Oral ETT  Additional Equipment:   Intra-op Plan:   Post-operative Plan: Extubation in OR  Informed Consent: I have reviewed the patients History and Physical, chart, labs and discussed the procedure including the risks, benefits and alternatives for the proposed anesthesia with the patient or authorized representative who has indicated his/her understanding and acceptance.   Dental advisory given  Plan Discussed with: CRNA and Anesthesiologist  Anesthesia Plan Comments:        Anesthesia Quick Evaluation

## 2017-03-25 NOTE — Interval H&P Note (Signed)
History and Physical Interval Note:  03/25/2017 11:18 AM  Bob Burton  has presented today for surgery, with the diagnosis of cholecystitis  The various methods of treatment have been discussed with the patient and family.   After consideration of risks, benefits and other options for treatment, the patient has consented to  Procedure(s): LAPAROSCOPIC CHOLECYSTECTOMY WITH INTRAOPERATIVE CHOLANGIOGRAM (N/A) as a surgical intervention .  The patient's history has been reviewed, patient examined, no change in status, stable for surgery.  I have reviewed the patient's chart and labs.  Questions were answered to the patient's satisfaction.     [name]

## 2017-03-25 NOTE — Anesthesia Procedure Notes (Signed)
Performed by: Janyra Barillas M, CRNA Oxygen Delivery Method: Simple face mask Placement Confirmation: positive ETCO2 and breath sounds checked- equal and bilateral Dental Injury: Teeth and Oropharynx as per pre-operative assessment        

## 2017-03-25 NOTE — Anesthesia Procedure Notes (Signed)
Procedure Name: Intubation Date/Time: 03/25/2017 12:09 PM Performed by: Minerva EndsMirarchi, Kasir Hallenbeck M, CRNA Pre-anesthesia Checklist: Patient identified, Emergency Drugs available, Suction available and Patient being monitored Patient Re-evaluated:Patient Re-evaluated prior to induction Oxygen Delivery Method: Circle System Utilized Preoxygenation: Pre-oxygenation with 100% oxygen Induction Type: IV induction Ventilation: Mask ventilation without difficulty Laryngoscope Size: Miller and 2 Grade View: Grade I Tube type: Oral Tube size: 7.0 mm Number of attempts: 1 Airway Equipment and Method: Stylet Placement Confirmation: ETT inserted through vocal cords under direct vision,  positive ETCO2 and breath sounds checked- equal and bilateral Secured at: 22 cm Tube secured with: Tape Dental Injury: Teeth and Oropharynx as per pre-operative assessment  Comments: Smooth IV induction Green-- intubation AM CRNA atraumatic-- teeth and mouth as preop--- bilat BS Green

## 2017-03-25 NOTE — Progress Notes (Signed)
PreOp Nursing Note: pt arrived into holding area, pt identified via arm band and pt stated name, DOB, and surgery to be done. Pt alert and oriented, IV site at rt forearm assessed, WNL, flushed and LR anesthesia line initiated per orders. Pt ambulated to restroom prior to surgery. Safety measures in place. Chart reviewed, MAR reviewed

## 2017-03-26 ENCOUNTER — Encounter (HOSPITAL_COMMUNITY): Payer: Self-pay | Admitting: Surgery

## 2017-03-26 MED ORDER — HYDROCODONE-ACETAMINOPHEN 5-325 MG PO TABS: 1.0000 | ORAL_TABLET | ORAL | 0 refills | Status: DC | PRN

## 2017-03-26 NOTE — Progress Notes (Signed)
Pt is discharged to home. DC instructions given. No concerns voiced. Pt encouraged to stop by drug store and pick up drug that was prescribed electronically. Voiced understanding. Left unit in wheelchair pushed by nurse tech. Left in stable condition. Derinda SisVera Yonah Tangeman,rn.

## 2017-03-26 NOTE — Discharge Summary (Signed)
Physician Discharge Summary  Patient ID: Bob Burton MRN: [id number] DOB/AGE: 11-06-1955 62 y.o.  Admit date: 03/24/2017 Discharge date: 03/26/2017  Admission Diagnoses:  cholecystitis  Discharge Diagnoses:  same  Principal Problem:   Acute hemorrhagic cholecystitis s/p lap cholecystectomy 03/25/2017 Active Problems:   Hypothyroidism   Obstructive sleep apnea   Elevated liver enzymes   Hereditary hemochromatosis ([place])   Abnormal liver function   Surgery:  Lap cholecystectomy  Discharged Condition: improved  Hospital Course:   Had surgery on Sunday by Dr. Lucia Gaskins and did well.  Ready for discharge on Monday.  Incisions bland  Consults: none  Significant Diagnostic Studies: nasal swab + for MRSA    Discharge Exam: Blood pressure (!) 157/92, pulse (!) 110, temperature 97.9 F (36.6 C), temperature source Oral, resp. rate (!) 24, height 5' 11"  (1.803 m), weight 117.9 kg (260 lb), SpO2 96 %. Incisions are bland  Disposition: Final discharge disposition not confirmed  Discharge Instructions    Call MD for:  persistant nausea and vomiting   Complete by:  As directed    Call MD for:  redness, tenderness, or signs of infection (pain, swelling, redness, odor or green/yellow discharge around incision site)   Complete by:  As directed    Call MD for:  severe uncontrolled pain   Complete by:  As directed    Diet - low sodium heart healthy   Complete by:  As directed    Discharge wound care:   Complete by:  As directed    May shower and shampoo when you go home.   Increase activity slowly   Complete by:  As directed      Allergies as of 03/26/2017   No Known Allergies     Medication List    TAKE these medications   CYANOCOBALAMIN PO Take 1 tablet by mouth daily.   HYDROcodone-acetaminophen 5-325 MG tablet Commonly known as:  NORCO/VICODIN Take 1-2 tablets by mouth every 4 (four) hours as needed for moderate pain.   levothyroxine 150 MCG tablet Commonly  known as:  SYNTHROID, LEVOTHROID Take 150 mcg by mouth daily before breakfast.   lisinopril-hydrochlorothiazide 10-12.5 MG tablet Commonly known as:  PRINZIDE,ZESTORETIC Take 1 tablet by mouth every morning.   VITAMIN A PO Take 1 tablet by mouth daily.   VITAMIN D PO Take 1 tablet by mouth daily.            Discharge Care Instructions  (From admission, onward)        Start     Ordered   03/26/17 0000  Discharge wound care:    Comments:  May shower and shampoo when you go home.   03/26/17 1350     Follow-up Information    Alphonsa Overall, MD. Schedule an appointment as soon as possible for a visit in 3 week(s).   Specialty:  General Surgery Contact information: [address] [place]  [id number] [phone]           Signed: Pedro Earls 03/26/2017, 1:52 PM

## 2017-03-26 NOTE — Discharge Instructions (Signed)

## 2017-04-30 NOTE — Progress Notes (Signed)
[name]    HEMATOLOGY/ONCOLOGY CLINIC NOTE  Date of Service: 05/02/17  PCP: Dr Aura Dials MD   CHIEF COMPLAINTS/PURPOSE OF CONSULTATION:  F/u for mx of hemochromatosis  HISTORY OF PRESENTING ILLNESS:   Bob Burton is a wonderful 62 y.o. male who has been referred to [place] by Dr Aura Dials MD for evaluation and management of elevated ferritin with concern for hemochromatosis.  Patient has a history of hypertension, sleep apnea, hypothyroidism, gout who on routine labs with his primary care physician was noted to have was noted to have abnormal liver function tests in December 2015. At the time his AST was 73 and ALT was 124 with a normal bilirubin and alkaline phosphatase. With an ultrasound of the abdomen done on 02/02/2014 showing diffuse and markedly increased echogenicity of the liver or definition of the liver architecture suggestive of severe fatty infiltration of the liver. No focal hepatic lesions or intrahepatic biliary dilatation was noted. Spleen was noted to be normal in size and unremarkable. Workup at the time also showed that his ferritin level was 791.2 with an iron saturation of 41%.  He has been on treatment for his dyslipidemia. He notes that he is being followed by a liver specialist for management of his abnormal liver functions. He reports that he donated PRBCs every 2 months for 15 years until he moved to the [place] in 1997. After this he could not been a blood concerns with Mad cow disease in [place].  Patient notes no abdominal pain or distention. Does not report a clear family history of hemochromatosis or iron overload disorders or early heart disease or liver cirrhosis. His father however was disease due to suicide and it is unclear if he had any significant health problems.  INTERVAL HISTORY  Bob Burton is here for 6 month follow-up of his hemochromatosis. The patient's last visit with [place] was on 10/30/16. 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   Of note since the patient last visit, pt has had an emergency cholecystectomy three weeks ago. He notes he is maintaining follow up with the surgeon Dr. Alphonsa Overall and that he is healing well.   Lab results today (05/02/17) of CBC, CMP, and Reticulocytes is as follows: all values are WNL except for AST at 41 and ALT at 71. Iron and TIBC 05/02/17 reveal all WNL except for Saturation Ratio at 38. Ferritin levels 05/02/17 - 129  On review of systems, pt reports maintaining weight, good energy levels,  and denies abdominal pains, irregular bowel movements, and any other symptoms.   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 Now uses an oral device. 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  #8 elevated liver function tests  #9 history of gout #10 gastroesophageal reflux  SURGICAL HISTORY:  #1 right wrist surgery #2 reports he is scheduled for carpal tunnel syndrome surgery #3 Lasik 2007  SOCIAL HISTORY: Social History   Socioeconomic History  . Marital status: Married    Spouse name: Not on file  . Number of children: Not on file  . Years of education: Not on file  . Highest education level: Not on file  Social Needs  . Financial resource strain: Not on file  . Food insecurity -  worry: Not on file  . Food insecurity - inability: Not on file  . Transportation needs - medical: Not on file  . Transportation needs - non-medical: Not on file  Occupational History  . Not on file  Tobacco Use  . Smoking status: Never Smoker  . Smokeless tobacco: Never Used  Substance and Sexual Activity  . Alcohol use: No  . Drug use: No  . Sexual activity: Not on file  Other Topics  Concern  . Not on file  Social History Narrative  . 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  FAMILY HISTORY: Patient is originally from [place]  Father deceased-from suicide Mother 36 year old  ALLERGIES:  has No Known Allergies.  MEDICATIONS:  Current Outpatient Medications  Medication Sig Dispense Refill  . Cholecalciferol (VITAMIN D PO) Take 1 tablet by mouth daily.    . CYANOCOBALAMIN PO Take 1 tablet by mouth daily.    [name] HYDROcodone-acetaminophen (NORCO/VICODIN) 5-325 MG tablet Take 1-2 tablets by mouth every 4 (four) hours as needed for moderate pain. 30 tablet 0  . levothyroxine (SYNTHROID, LEVOTHROID) 150 MCG tablet Take 150 mcg by mouth daily before breakfast.     . lisinopril-hydrochlorothiazide (PRINZIDE,ZESTORETIC) 10-12.5 MG tablet Take 1 tablet by mouth every morning.   0  . VITAMIN A PO Take 1 tablet by mouth daily.     No current facility-administered medications for this visit.     REVIEW OF SYSTEMS:    .10 Point review of Systems was done is negative except as noted above.   PHYSICAL EXAMINATION: ECOG PERFORMANCE STATUS: 0 - Asymptomatic  Vitals:   05/02/17 1313  BP: 121/84  Pulse: 72  Resp: 18  Temp: 98.5 F (36.9 C)  SpO2: 98%   Filed Weights   05/02/17 1313  Weight: 270 lb 12.8 oz (122.8 kg)   .Body mass index is 37.77 kg/m.  [name] GENERAL:alert, in no acute distress and comfortable SKIN: no acute rashes, no significant lesions EYES: conjunctiva are pink and non-injected, sclera anicteric OROPHARYNX: MMM, no exudates, no oropharyngeal erythema or ulceration NECK: supple, no JVD LYMPH:  no palpable lymphadenopathy in the cervical, axillary or  inguinal regions LUNGS: clear to auscultation b/l with normal respiratory effort HEART: regular rate & rhythm ABDOMEN:  normoactive bowel sounds ,healed surgical incisions. Extremity: no pedal edema PSYCH: alert & oriented x 3 with fluent speech NEURO: no focal motor/sensory deficits    LABORATORY DATA:  I have reviewed the data as listed  . CBC Latest Ref Rng & Units 05/02/2017 03/24/2017 10/30/2016  WBC 4.0 - 10.3 K/uL 6.8 14.8(H) 5.6  Hemoglobin 13.0 - 17.1 g/dL 17.0 17.3(H) 15.6  Hematocrit 38.4 - 49.9 % 47.8 48.8 45.0  Platelets 140 - 400 K/uL 173 164 190   . CMP Latest Ref Rng & Units 05/02/2017 03/24/2017 10/30/2016  Glucose 70 - 140 mg/dL 116 155(H) 87  BUN 7 - 26 mg/dL 20 23(H) 18.5  Creatinine 0.70 - 1.30 mg/dL 1.08 1.04 1.1  Sodium 136 - 145 mmol/L 139 140 141  Potassium 3.5 - 5.1 mmol/L 4.0 3.6 4.4  Chloride 98 - 109 mmol/L 106 108 -  CO2 22 - 29 mmol/L 24 21(L) 26  Calcium  8.4 - 10.4 mg/dL 9.8 9.7 9.8  Total Protein 6.4 - 8.3 g/dL 7.5 8.0 7.1  Total Bilirubin 0.2 - 1.2 mg/dL 0.6 0.8 0.74  Alkaline Phos 40 - 150 U/L 79 57 78  AST 5 - 34 U/L 41(H) 46(H) 48(H)  ALT 0 - 55 U/L 71(H) 75(H) 84(H)            RADIOGRAPHIC STUDIES: I have personally reviewed the radiological images as listed and agreed with the findings in the report. No results found.  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 Patient's ferritin at baseline appears to be around 700's. It was down to the 46 after serial therapeutic phlebotomies and now is back up to 129 As noted about he was a blood donor for 15 years prior to 1997 which was probably somewhat protective. Most patients with isolated H63D carrier state did not develop overt clinical presentations of hemochromatosis. Patient does not report any overt joint issues. Has had no cardiac issues or cardiac arrhythmias. Has had elevated liver function  tests attributable both to steatohepatitis.  #2 Elevated liver function tests Based on liver biopsy is predominantly from steatohepatitis with stage I fibrosis. No overt evidence of iron overload. Patient has a liver specialist that he is seen by Roosevelt Locks NP) LFTs slightly improved  PLAN: -ferritin 129, labs reviewed with patient. -continue maintenance therapeutic phlebotomies with ferritin assessments q6 months to try to keep ferritin levels <100. -Counseled on minimizing or completely avoiding alcohol use. -Avoid other medications that could cause liver injury -Discussed pt labwork today; CBC all WNL and liver chemistries getting better with AST at 41 and ALT at 71; likely due to fatty liver element.  -f/u with PCP/GI for [place] Abd and AFP tumor markers q6 months for [place] screening  #3 hypertension, dyslipidemia, hypothyroidism, sleep apnea, GERD -Continue management as per primary care physician   RTC with Dr Irene Limbo with labs in 6 months Plz schedule therapeutic phlebotomy in 6 month(on day of labs and clinic visit)  All of the patients questions were answered with apparent satisfaction. The patient knows to call the clinic with any problems, questions or concerns.  . The total time spent in the appointment was 15 minutes and more than 50% was on counseling and direct patient cares.    Sullivan Lone MD MS AAHIVMS [facility] Hematology/Oncology Physician [place]  (Office):       [phone] (Work cell):  [phone] (Fax):           [phone]  04/30/2017 3:31 PM   This document serves as a record of services personally performed by Sullivan Lone, MD. It was created on his behalf by Baldwin Jamaica, a trained medical scribe. The creation of this record is based on the scribe's personal observations and the provider's statements to them.   .I have reviewed the above documentation for accuracy and completeness, and I agree with the above. Brunetta Genera MD  MS

## 2017-05-02 ENCOUNTER — Inpatient Hospital Stay: Payer: Commercial Managed Care - PPO | Attending: Hematology | Admitting: Hematology

## 2017-05-02 ENCOUNTER — Inpatient Hospital Stay: Payer: Commercial Managed Care - PPO

## 2017-05-02 ENCOUNTER — Telehealth: Payer: Self-pay

## 2017-05-02 DIAGNOSIS — E785 Hyperlipidemia, unspecified: Secondary | ICD-10-CM

## 2017-05-02 DIAGNOSIS — I10 Essential (primary) hypertension: Secondary | ICD-10-CM

## 2017-05-02 DIAGNOSIS — K7581 Nonalcoholic steatohepatitis (NASH): Secondary | ICD-10-CM | POA: Insufficient documentation

## 2017-05-02 DIAGNOSIS — R945 Abnormal results of liver function studies: Secondary | ICD-10-CM

## 2017-05-02 DIAGNOSIS — E039 Hypothyroidism, unspecified: Secondary | ICD-10-CM

## 2017-05-02 LAB — COMPREHENSIVE METABOLIC PANEL
ALBUMIN: 4 g/dL (ref 3.5–5.0)
ALT: 71 U/L — ABNORMAL HIGH (ref 0–55)
AST: 41 U/L — AB (ref 5–34)
Alkaline Phosphatase: 79 U/L (ref 40–150)
Anion gap: 9 (ref 3–11)
BILIRUBIN TOTAL: 0.6 mg/dL (ref 0.2–1.2)
BUN: 20 mg/dL (ref 7–26)
CO2: 24 mmol/L (ref 22–29)
CREATININE: 1.08 mg/dL (ref 0.70–1.30)
Calcium: 9.8 mg/dL (ref 8.4–10.4)
Chloride: 106 mmol/L (ref 98–109)
GFR calc Af Amer: >60 mL/min (ref >60)
GLUCOSE: 116 mg/dL (ref 70–140)
POTASSIUM: 4 mmol/L (ref 3.5–5.1)
Sodium: 139 mmol/L (ref 136–145)
TOTAL PROTEIN: 7.5 g/dL (ref 6.4–8.3)

## 2017-05-02 LAB — CBC WITH DIFFERENTIAL/PLATELET
BASOS ABS: 0 10*3/uL (ref 0.0–0.1)
Basophils Relative: 0 %
EOS ABS: 0.2 10*3/uL (ref 0.0–0.5)
EOS PCT: 3 %
HCT: 47.8 % (ref 38.4–49.9)
Hemoglobin: 17 g/dL (ref 13.0–17.1)
LYMPHS ABS: 2.9 10*3/uL (ref 0.9–3.3)
LYMPHS PCT: 43 %
MCH: 31.3 pg (ref 27.2–33.4)
MCHC: 35.6 g/dL (ref 32.0–36.0)
MCV: 87.9 fL (ref 79.3–98.0)
MONO ABS: 0.8 10*3/uL (ref 0.1–0.9)
Monocytes Relative: 12 %
Neutro Abs: 2.9 10*3/uL (ref 1.5–6.5)
Neutrophils Relative %: 42 %
PLATELETS: 173 10*3/uL (ref 140–400)
RBC: 5.44 MIL/uL (ref 4.20–5.82)
RDW: 14.2 % (ref 11.0–14.6)
WBC: 6.8 10*3/uL (ref 4.0–10.3)

## 2017-05-02 LAB — IRON AND TIBC
IRON: 130 ug/dL (ref 42–163)
Saturation Ratios: 38 % — ABNORMAL LOW (ref 42–163)
TIBC: 340 ug/dL (ref 202–409)
UIBC: 210 ug/dL

## 2017-05-02 LAB — RETICULOCYTES
RBC.: 5.44 MIL/uL (ref 4.20–5.82)
RETIC CT PCT: 1.6 % (ref 0.8–1.8)
Retic Count, Absolute: 87 10*3/uL (ref 34.8–93.9)

## 2017-05-02 LAB — FERRITIN: Ferritin: 129 ng/mL (ref 22–316)

## 2017-05-02 NOTE — Progress Notes (Signed)
Bob Burton presents today for phlebotomy per MD orders. Phlebotomy procedure started at 1452 with 16G in right AC and ended at 1500. 560 grams removed. Patient observed for 30 minutes after procedure without any incident. Patient tolerated procedure well. IV needle removed intact.

## 2017-05-02 NOTE — Patient Instructions (Signed)

## 2017-05-02 NOTE — Telephone Encounter (Signed)
Printed avs and calender of upcoming appointment. Per 2/27 los 

## 2017-10-20 IMAGING — US US BIOPSY
1 series · 8 of 8 positions shown · non-contrast
Comparison: none

INDICATION: 60-year-old male with elevated liver enzymes, hepatic steatosis and
hemochromatosis

[Series 1: us biopsy · 0.23mm/px · 8 acquisitions, 8 frames shown]
[im 1/8]
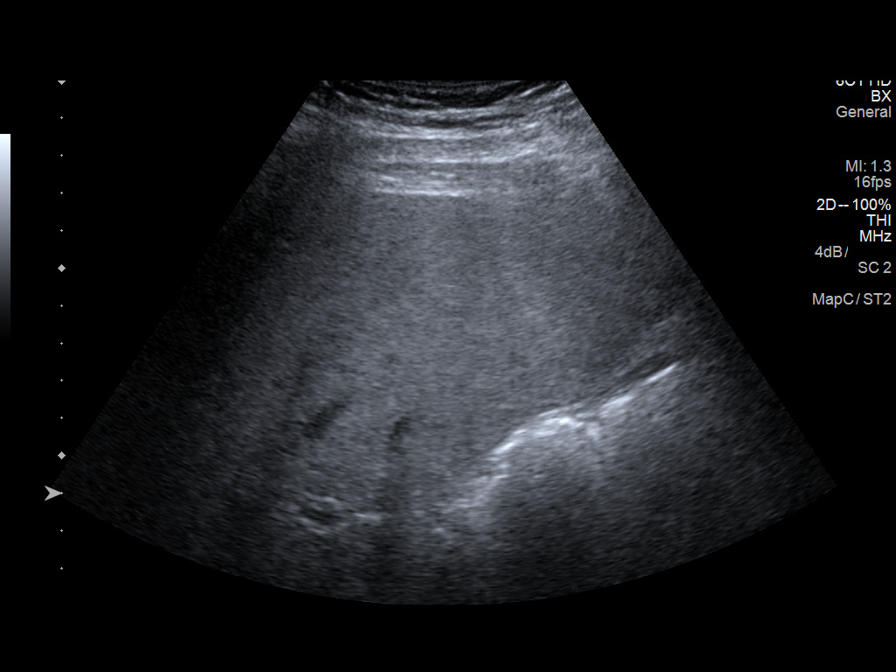
[im 2/8]
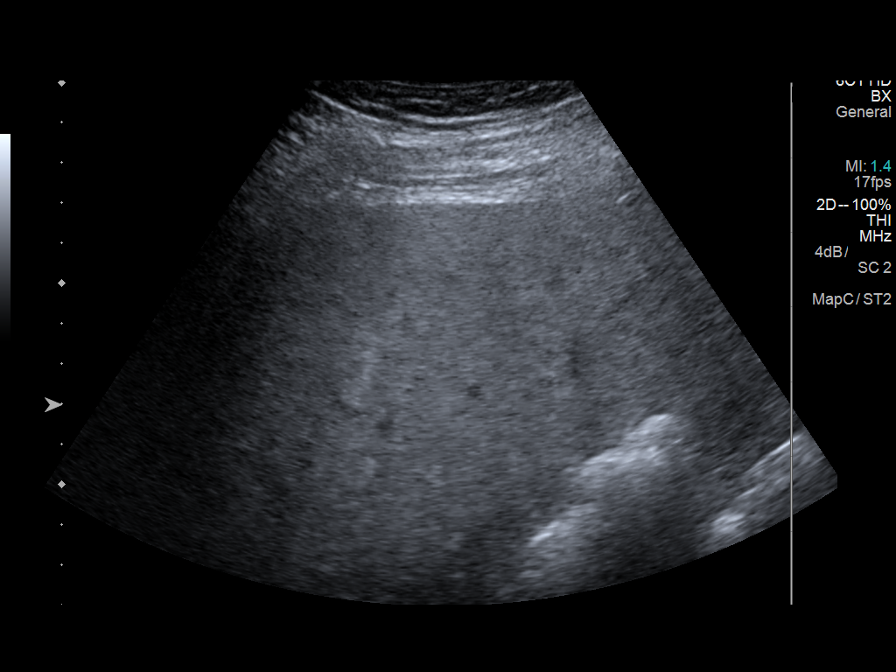
[im 3/8]
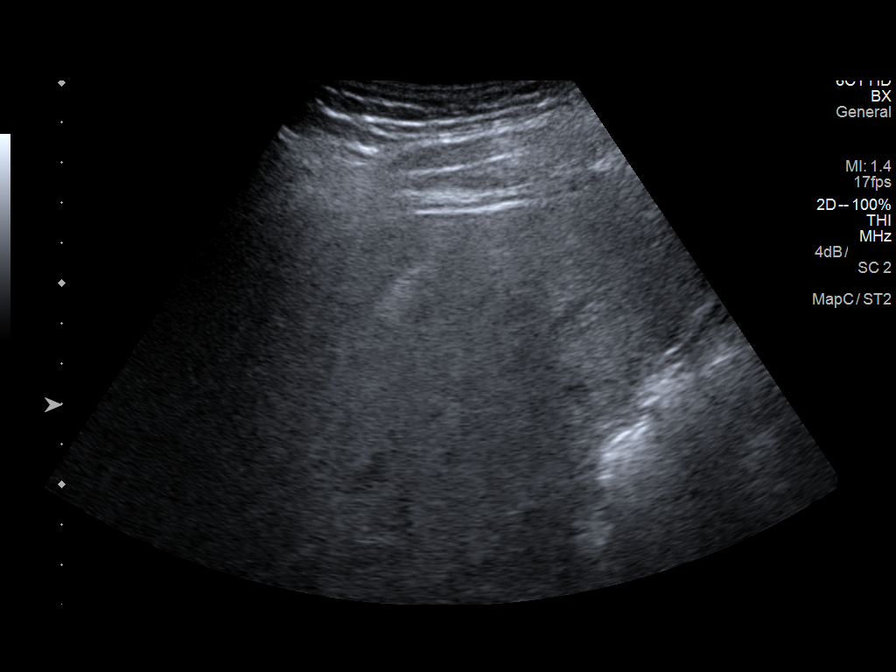
[im 4/8]
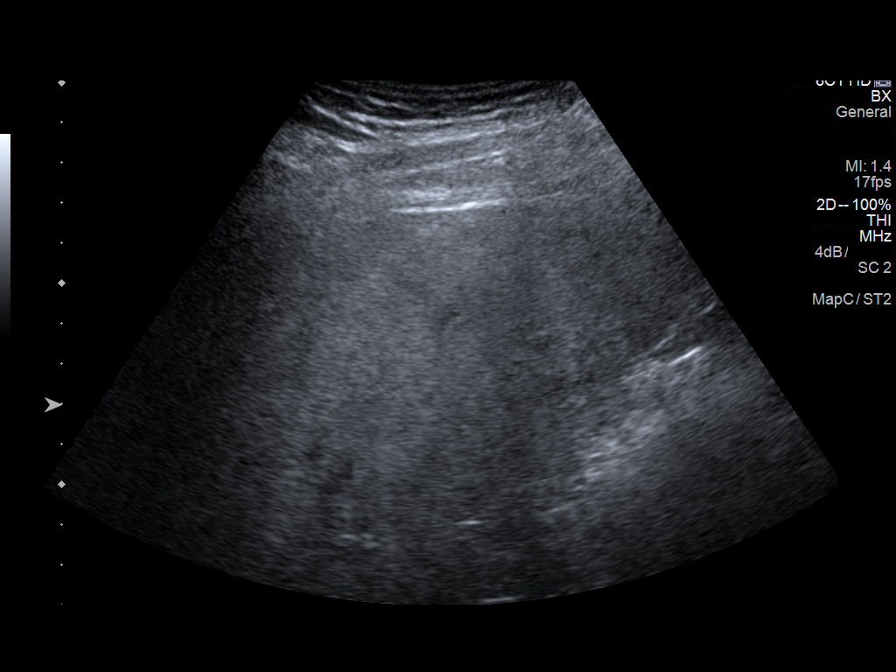
[im 5/8]
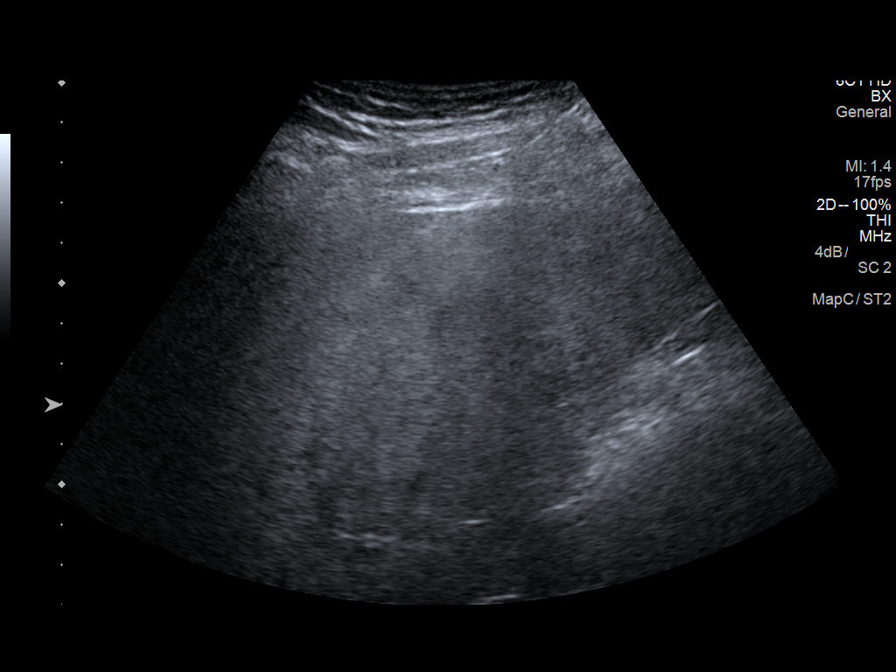
[im 6/8]
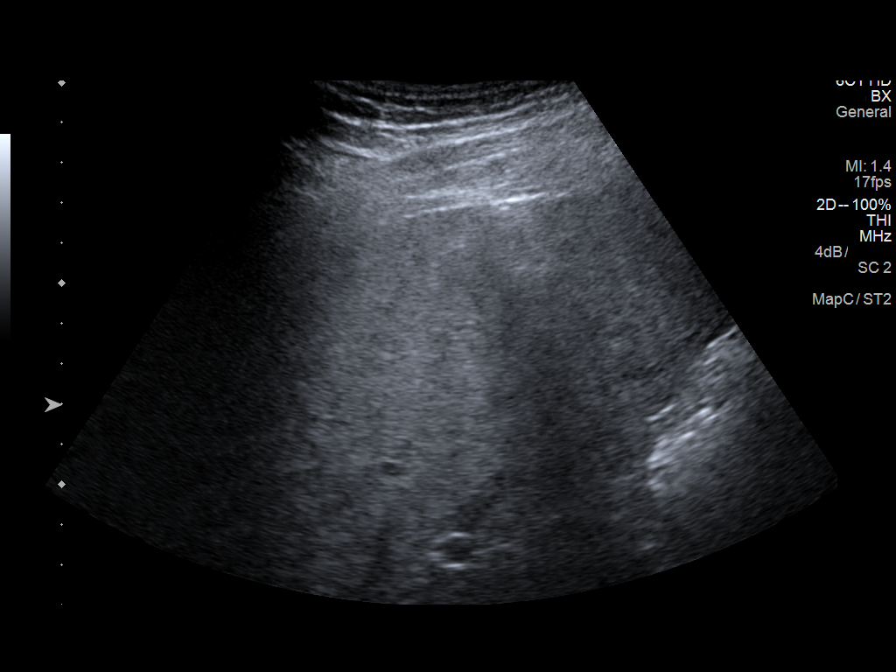
[im 7/8]
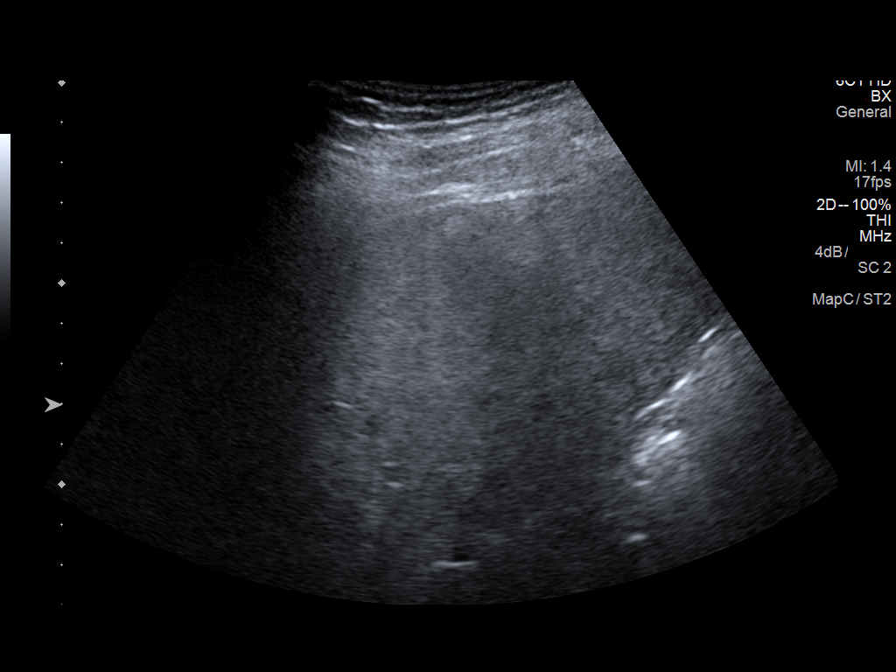
[im 8/8]
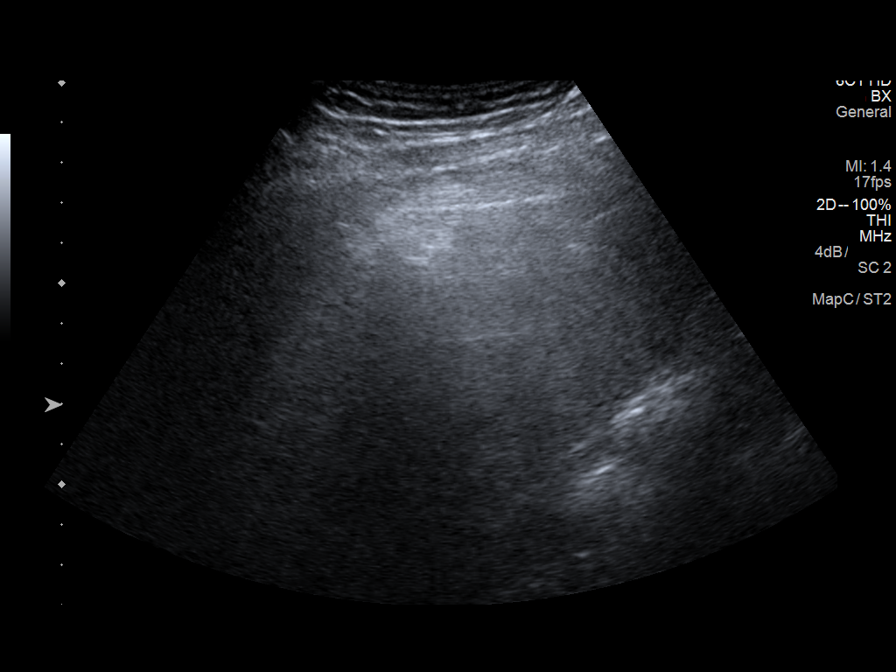

[8 of 8 positions shown; findings below may reference images not displayed]

EXAM:
Ultrasound-guided core biopsy of the liver

MEDICATIONS:
None.

ANESTHESIA/SEDATION:
Fentanyl 125 mcg IV; Versed 3 mg IV

Moderate Sedation Time:  11 minutes

The patient was continuously monitored during the procedure by the
interventional radiology nurse under my direct supervision.

FLUOROSCOPY TIME:  Fluoroscopy Time: 0 minutes 0 seconds (0 mGy).

COMPLICATIONS:
None immediate.

PROCEDURE:
Informed consent was obtained from the patient following explanation
of the procedure, risks, benefits and alternatives. The patient
understands, agrees and consents for the procedure. All questions
were addressed. A time out was performed.

The right upper quadrant was interrogated with ultrasound. A
relatively avascular plane of the liver was identified. A suitable
skin entry site was selected and marked. The region was then
sterilely prepped and draped in standard fashion using chlorhexidine
skin prep. Local anesthesia was attained by infiltration with 1%
lidocaine. A small dermatotomy was made.

Under real-time sonographic guidance, a 17 gauge trocar needle was
advanced into the liver. Multiple 18 gauge core biopsies were then
coaxially obtained. Needle placement was confirmed on all biopsy
passes with real-time sonography. Biopsy specimens were placed in
formalin and delivered to pathology for further analysis.

As the introducer needle was removed, the biopsy tract was embolized
with a Gel-Foam slurry. Post biopsy ultrasound imaging demonstrates
no active bleeding or perihepatic hematoma. The patient tolerated
the procedure well.
IMPRESSION: Technically successful ultrasound-guided random core biopsy of the
liver.

## 2017-10-30 NOTE — Progress Notes (Signed)
[name]    HEMATOLOGY/ONCOLOGY CLINIC NOTE  Date of Service: 05/02/17  PCP: Dr Aura Dials MD   CHIEF COMPLAINTS/PURPOSE OF CONSULTATION:  F/u for mx of hemochromatosis  HISTORY OF PRESENTING ILLNESS:   Jukka-Petri E Griffith is a wonderful 62 y.o. male who has been referred to [place] by Dr Aura Dials MD for evaluation and management of elevated ferritin with concern for hemochromatosis.  Patient has a history of hypertension, sleep apnea, hypothyroidism, gout who on routine labs with his primary care physician was noted to have was noted to have abnormal liver function tests in December 2015. At the time his AST was 73 and ALT was 124 with a normal bilirubin and alkaline phosphatase. With an ultrasound of the abdomen done on 02/02/2014 showing diffuse and markedly increased echogenicity of the liver or definition of the liver architecture suggestive of severe fatty infiltration of the liver. No focal hepatic lesions or intrahepatic biliary dilatation was noted. Spleen was noted to be normal in size and unremarkable. Workup at the time also showed that his ferritin level was 791.2 with an iron saturation of 41%.  He has been on treatment for his dyslipidemia. He notes that he is being followed by a liver specialist for management of his abnormal liver functions. He reports that he donated PRBCs every 2 months for 15 years until he moved to the [place] in 1997. After this he could not been a blood concerns with Mad cow disease in [place].  Patient notes no abdominal pain or distention. Does not report a clear family history of hemochromatosis or iron overload disorders or early heart disease or liver cirrhosis. His father however was disease due to suicide and it is unclear if he had any significant health problems.  INTERVAL HISTORY  Jukka-Petri E Qin is here for management and evaluation of his hemochromatosis. The patient's last visit with [place] was on 05/02/17. The pt reports that he  is doing well overall.   The pt reports that he has had no new concerns develop in the last 6 months. He continues to have stable energy levels and has continued staying active. He notes that he had a gout attack four weeks ago in his ankles and was evaluated for this, and his symptoms dissipated after taking a medication.   Lab results today (10/31/17) of CBC w/diff, CMP, and Reticulocytes is as follows: all values are WNL except for Glucose at 113, AST at 43, ALT at 74. Ferritin 10/31/17 at 124  On review of systems, pt reports stable energy levels, staying active, and denies fevers, chills, night sweats, abdominal pains, leg swelling, and any other symptoms.   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 Now uses an oral device. 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  #8 elevated liver function tests  #9 history of gout #10 gastroesophageal reflux  SURGICAL HISTORY:  #1 right wrist surgery #2 reports he is scheduled for carpal tunnel syndrome surgery #3 Lasik 2007  SOCIAL HISTORY: Social History   Socioeconomic History  . Marital status: Married    Spouse name: Not on file  . Number of children: Not on file  . Years of education: Not on file  . Highest education level: Not on file  Occupational History  . Not on file  Social Needs  . Emergency planning/management officer  strain: Not on file  . Food insecurity:    Worry: Not on file    Inability: Not on file  . Transportation needs:    Medical: Not on file    Non-medical: Not on file  Tobacco Use  . Smoking status: Never Smoker  . Smokeless tobacco: Never Used  Substance and Sexual Activity  . Alcohol use: No  . Drug use: No  . Sexual activity: Not on file  Lifestyle  . Physical activity:     Days per week: Not on file    Minutes per session: Not on file  . Stress: Not on file  Relationships  . Social connections:    Talks on phone: Not on file    Gets together: Not on file    Attends religious service: Not on file    Active member of club or organization: Not on file    Attends meetings of clubs or organizations: Not on file    Relationship status: Not on file  . Intimate partner violence:    Fear of current or ex partner: Not on file    Emotionally abused: Not on file    Physically abused: Not on file    Forced sexual activity: Not on file  Other Topics Concern  . Not on file  Social History Narrative  . 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  FAMILY HISTORY: Patient is originally from [place]  Father deceased-from suicide Mother 52 year old  ALLERGIES:  has No Known Allergies.  MEDICATIONS:  Current Outpatient Medications  Medication Sig Dispense Refill  . Cholecalciferol (VITAMIN D PO) Take 1 tablet by mouth daily.    . CYANOCOBALAMIN PO Take 1 tablet by mouth daily.    [name] HYDROcodone-acetaminophen (NORCO/VICODIN) 5-325 MG tablet Take 1-2 tablets by mouth every 4 (four) hours as needed for moderate pain. 30 tablet 0  . levothyroxine (SYNTHROID, LEVOTHROID) 150 MCG tablet Take 150 mcg by mouth daily before breakfast.     . lisinopril-hydrochlorothiazide (PRINZIDE,ZESTORETIC) 10-12.5 MG tablet Take 1 tablet by mouth every morning.   0  . tamsulosin (FLOMAX) 0.4 MG CAPS capsule Take 0.4 mg by mouth at bedtime.    [name] VITAMIN A PO Take 1 tablet by mouth daily.     No current facility-administered medications for this visit.     REVIEW OF SYSTEMS:    A 10+ POINT  REVIEW OF SYSTEMS WAS OBTAINED including neurology, dermatology, psychiatry, cardiac, respiratory, lymph, extremities, GI, GU, Musculoskeletal, constitutional, breasts, reproductive, HEENT.  All pertinent positives are noted in the HPI.  All others are negative.   PHYSICAL EXAMINATION: ECOG PERFORMANCE STATUS: 0 - Asymptomatic  Vitals:   10/31/17 1248  BP: 105/69  Pulse: 88  Resp: 18  Temp: 98.6 F (37 C)  SpO2: 97%   Filed Weights   10/31/17 1248  Weight: 275 lb 3.2 oz (124.8 kg)   .Body mass index is 38.38 kg/m.  GENERAL:alert, in no acute distress and comfortable SKIN: no acute rashes, no significant lesions EYES: conjunctiva are pink and non-injected, sclera anicteric OROPHARYNX: MMM, no exudates, no oropharyngeal erythema or ulceration NECK: supple, no JVD LYMPH:  no palpable lymphadenopathy in the cervical, axillary or inguinal regions  LUNGS: clear to auscultation b/l with normal respiratory effort HEART: regular rate & rhythm ABDOMEN:  normoactive bowel sounds , non tender, not distended. No palpable hepatosplenomegaly.  Extremity: no pedal edema PSYCH: alert & oriented x 3 with fluent speech NEURO: no focal motor/sensory deficits   LABORATORY DATA:  I have reviewed the data as listed  . CBC Latest Ref Rng & Units 10/31/2017 05/02/2017 03/24/2017  WBC 4.0 - 10.3 K/uL 5.6 6.8 14.8(H)  Hemoglobin 13.0 - 17.1 g/dL 16.5 17.0 17.3(H)  Hematocrit 38.4 - 49.9 % 47.1 47.8 48.8  Platelets 140 - 400 K/uL 158 173 164   . CMP Latest Ref Rng & Units 10/31/2017 05/02/2017 03/24/2017  Glucose 70 - 99 mg/dL 113(H) 116 155(H)  BUN 8 - 23 mg/dL 18 20 23(H)  Creatinine 0.61 - 1.24 mg/dL 1.04 1.08 1.04  Sodium 135 - 145 mmol/L 140 139 140  Potassium 3.5 - 5.1 mmol/L 4.0 4.0 3.6  Chloride 98 - 111 mmol/L 107 106 108  CO2 22 - 32 mmol/L 27 24 21(L)  Calcium 8.9 - 10.3 mg/dL 9.8 9.8 9.7  Total Protein 6.5 - 8.1 g/dL 7.3 7.5 8.0  Total Bilirubin 0.3 - 1.2 mg/dL 0.5 0.6 0.8  Alkaline  Phos 38 - 126 U/L 68 79 57  AST 15 - 41 U/L 43(H) 41(H) 46(H)  ALT 0 - 44 U/L 74(H) 71(H) 75(H)            RADIOGRAPHIC STUDIES: I have personally reviewed the radiological images as listed and agreed with the findings in the report. No results found.  ASSESSMENT & PLAN:   62 y.o. 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 Patient's ferritin at baseline appears to be around 700's. It was down to the 46 after serial therapeutic phlebotomies and now is back up to 129 As noted about he was a blood donor for 15 years prior to 1997 which was probably somewhat protective. Most patients with isolated H63D carrier state did not develop overt clinical presentations of hemochromatosis. Patient does not report any overt joint issues. Has had no cardiac issues or cardiac arrhythmias. Has had elevated liver function tests attributable both to steatohepatitis.  #2 Elevated liver function tests Based on liver biopsy is predominantly from steatohepatitis with stage I fibrosis. No overt evidence of iron overload. Patient has a liver specialist that he is seen by Roosevelt Locks NP) LFTs stable. PLAN: -Goal for Ferritin <100 -Discussed pt labwork today, 10/31/17; HGB at 16.5, HCT at 47.1, Ferritin at 124, liver functions stable with AST at 43 and ALT at 74 -therapeutic phlebotomy today -continue follow up with Dr. Roosevelt Locks for liver management -Counseled on minimizing or completely avoiding alcohol use. -Avoid other medications that could cause liver injury -f/u with PCP/GI for [place] Abd and AFP tumor markers q6 months for [place] screening  -Continue with therapeutic phlebotomy and again in 6 months   #3 hypertension, dyslipidemia, hypothyroidism, sleep apnea, GERD -Continue management as per primary care physician   RTC with Dr Irene Limbo with labs in 6 months Plz schedule therapeutic phlebotomy in 6 month(on day of labs and clinic  visit)    All of the patients questions were answered with apparent satisfaction. The patient knows to call the clinic with any problems, questions or concerns.  The total time spent in the appt was 20 minutes and more than 50% was on counseling and direct patient cares.    Sullivan Lone MD [place] AAHIVMS [facility]  [facility] Hematology/Oncology Physician [facility]  (Office):       [phone] (Work cell):  [phone] (Fax):           [phone]  10/31/2017 1:39 PM   I, Baldwin Jamaica, am acting as a scribe for Dr. Irene Limbo  .I have reviewed the above documentation for accuracy and completeness, and I agree with the above. Brunetta Genera MD

## 2017-10-31 ENCOUNTER — Inpatient Hospital Stay: Payer: Commercial Managed Care - PPO | Attending: Hematology

## 2017-10-31 ENCOUNTER — Encounter: Payer: Self-pay | Admitting: Hematology

## 2017-10-31 ENCOUNTER — Inpatient Hospital Stay: Payer: Commercial Managed Care - PPO

## 2017-10-31 ENCOUNTER — Inpatient Hospital Stay (HOSPITAL_BASED_OUTPATIENT_CLINIC_OR_DEPARTMENT_OTHER): Payer: Commercial Managed Care - PPO | Admitting: Hematology

## 2017-10-31 ENCOUNTER — Other Ambulatory Visit: Payer: Commercial Managed Care - PPO

## 2017-10-31 ENCOUNTER — Telehealth: Payer: Self-pay | Admitting: Hematology

## 2017-10-31 ENCOUNTER — Ambulatory Visit: Payer: Commercial Managed Care - PPO | Admitting: Hematology

## 2017-10-31 DIAGNOSIS — M109 Gout, unspecified: Secondary | ICD-10-CM | POA: Diagnosis not present

## 2017-10-31 DIAGNOSIS — G4733 Obstructive sleep apnea (adult) (pediatric): Secondary | ICD-10-CM | POA: Insufficient documentation

## 2017-10-31 DIAGNOSIS — R945 Abnormal results of liver function studies: Secondary | ICD-10-CM

## 2017-10-31 DIAGNOSIS — E785 Hyperlipidemia, unspecified: Secondary | ICD-10-CM | POA: Insufficient documentation

## 2017-10-31 DIAGNOSIS — I10 Essential (primary) hypertension: Secondary | ICD-10-CM | POA: Insufficient documentation

## 2017-10-31 DIAGNOSIS — K7689 Other specified diseases of liver: Secondary | ICD-10-CM

## 2017-10-31 DIAGNOSIS — E039 Hypothyroidism, unspecified: Secondary | ICD-10-CM | POA: Diagnosis not present

## 2017-10-31 DIAGNOSIS — Z79899 Other long term (current) drug therapy: Secondary | ICD-10-CM | POA: Diagnosis not present

## 2017-10-31 DIAGNOSIS — E063 Autoimmune thyroiditis: Secondary | ICD-10-CM | POA: Insufficient documentation

## 2017-10-31 LAB — CMP (CANCER CENTER ONLY)
ALT: 74 U/L — ABNORMAL HIGH (ref 0–44)
ANION GAP: 6 (ref 5–15)
AST: 43 U/L — ABNORMAL HIGH (ref 15–41)
Albumin: 3.9 g/dL (ref 3.5–5.0)
Alkaline Phosphatase: 68 U/L (ref 38–126)
BUN: 18 mg/dL (ref 8–23)
CHLORIDE: 107 mmol/L (ref 98–111)
CO2: 27 mmol/L (ref 22–32)
CREATININE: 1.04 mg/dL (ref 0.61–1.24)
Calcium: 9.8 mg/dL (ref 8.9–10.3)
Glucose, Bld: 113 mg/dL — ABNORMAL HIGH (ref 70–99)
POTASSIUM: 4 mmol/L (ref 3.5–5.1)
Sodium: 140 mmol/L (ref 135–145)
TOTAL PROTEIN: 7.3 g/dL (ref 6.5–8.1)
Total Bilirubin: 0.5 mg/dL (ref 0.3–1.2)

## 2017-10-31 LAB — RETICULOCYTES
RBC.: 5.3 MIL/uL (ref 4.20–5.82)
Retic Count, Absolute: 84.8 10*3/uL (ref 34.8–93.9)
Retic Ct Pct: 1.6 % (ref 0.8–1.8)

## 2017-10-31 LAB — CBC WITH DIFFERENTIAL (CANCER CENTER ONLY)
BASOS ABS: 0 10*3/uL (ref 0.0–0.1)
BASOS PCT: 1 %
EOS PCT: 3 %
Eosinophils Absolute: 0.2 10*3/uL (ref 0.0–0.5)
HEMATOCRIT: 47.1 % (ref 38.4–49.9)
Hemoglobin: 16.5 g/dL (ref 13.0–17.1)
LYMPHS PCT: 41 %
Lymphs Abs: 2.4 10*3/uL (ref 0.9–3.3)
MCH: 31.1 pg (ref 27.2–33.4)
MCHC: 35 g/dL (ref 32.0–36.0)
MCV: 88.9 fL (ref 79.3–98.0)
MONOS PCT: 14 %
Monocytes Absolute: 0.8 10*3/uL (ref 0.1–0.9)
Neutro Abs: 2.3 10*3/uL (ref 1.5–6.5)
Neutrophils Relative %: 41 %
PLATELETS: 158 10*3/uL (ref 140–400)
RBC: 5.3 MIL/uL (ref 4.20–5.82)
RDW: 14.1 % (ref 11.0–14.6)
WBC Count: 5.6 10*3/uL (ref 4.0–10.3)

## 2017-10-31 LAB — IRON AND TIBC
IRON: 95 ug/dL (ref 42–163)
Saturation Ratios: 28 % — ABNORMAL LOW (ref 42–163)
TIBC: 343 ug/dL (ref 202–409)
UIBC: 248 ug/dL

## 2017-10-31 LAB — FERRITIN: FERRITIN: 124 ng/mL (ref 24–336)

## 2017-10-31 NOTE — Progress Notes (Signed)
Bob Burton presents today for phlebotomy per MD orders. Phlebotomy procedure started at 1408, 18 gauge IV inserted in Right Anterior Forearm and ended at 1418 500 grams removed. IV needle removed intact. Patient observed for 30 minutes after procedure without any incident. Patient tolerated procedure well. Vital signs stable. Food and drinks provided.

## 2017-10-31 NOTE — Telephone Encounter (Signed)
Appts scheduled AVS/Calendar printed per 8/28 los

## 2017-10-31 NOTE — Patient Instructions (Signed)

## 2018-03-27 DIAGNOSIS — R3912 Poor urinary stream: Secondary | ICD-10-CM | POA: Diagnosis not present

## 2018-03-27 DIAGNOSIS — N401 Enlarged prostate with lower urinary tract symptoms: Secondary | ICD-10-CM | POA: Diagnosis not present

## 2018-04-01 DIAGNOSIS — N401 Enlarged prostate with lower urinary tract symptoms: Secondary | ICD-10-CM | POA: Diagnosis not present

## 2018-04-01 DIAGNOSIS — R3912 Poor urinary stream: Secondary | ICD-10-CM | POA: Diagnosis not present

## 2018-04-01 DIAGNOSIS — R972 Elevated prostate specific antigen [PSA]: Secondary | ICD-10-CM | POA: Diagnosis not present

## 2018-04-12 DIAGNOSIS — I10 Essential (primary) hypertension: Secondary | ICD-10-CM | POA: Diagnosis not present

## 2018-04-12 DIAGNOSIS — Z0000 Encounter for general adult medical examination without abnormal findings: Secondary | ICD-10-CM | POA: Diagnosis not present

## 2018-04-12 DIAGNOSIS — E785 Hyperlipidemia, unspecified: Secondary | ICD-10-CM | POA: Diagnosis not present

## 2018-04-30 NOTE — Progress Notes (Signed)
[name]    HEMATOLOGY/ONCOLOGY CLINIC NOTE  Date of Service: 05/01/18  PCP: Dr Aura Dials MD   CHIEF COMPLAINTS/PURPOSE OF CONSULTATION:  F/u for mx of hemochromatosis  HISTORY OF PRESENTING ILLNESS:   Bob Burton is a wonderful 63 y.o. male who has been referred to [place] by Dr Aura Dials MD for evaluation and management of elevated ferritin with concern for hemochromatosis.  Patient has a history of hypertension, sleep apnea, hypothyroidism, gout who on routine labs with his primary care physician was noted to have was noted to have abnormal liver function tests in December 2015. At the time his AST was 73 and ALT was 124 with a normal bilirubin and alkaline phosphatase. With an ultrasound of the abdomen done on 02/02/2014 showing diffuse and markedly increased echogenicity of the liver or definition of the liver architecture suggestive of severe fatty infiltration of the liver. No focal hepatic lesions or intrahepatic biliary dilatation was noted. Spleen was noted to be normal in size and unremarkable. Workup at the time also showed that his ferritin level was 791.2 with an iron saturation of 41%.  He has been on treatment for his dyslipidemia. He notes that he is being followed by a liver specialist for management of his abnormal liver functions. He reports that he donated PRBCs every 2 months for 15 years until he moved to the [place] in 1997. After this he could not been a blood concerns with Mad cow disease in [place].  Patient notes no abdominal pain or distention. Does not report a clear family history of hemochromatosis or iron overload disorders or early heart disease or liver cirrhosis. His father however was disease due to suicide and it is unclear if he had any significant health problems.  INTERVAL HISTORY  Bob Burton is here for management and evaluation of his hemochromatosis. The patient's last visit with [place] was on 10/31/17. The pt reports that he  is doing well overall.   The pt reports that he has not developed any new concerns since our last visit. He continues to enjoy stable energy levels. 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   Lab results today (05/01/18) of CBC w/diff and CMP is as follows: all values are WNL except for AST at 55, ALT at 101. 05/01/18 Ferritin is 130  On review of systems, pt reports stable energy levels, keeping active, eating well, and denies abdominal pains, leg swelling, and any other symptoms.   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 Now uses an oral device. 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  #8 elevated liver function tests  #9 history of gout #10 gastroesophageal reflux  SURGICAL HISTORY:  #1 right wrist surgery #2 reports he is scheduled for carpal tunnel syndrome surgery #3 Lasik 2007  SOCIAL HISTORY: Social History   Socioeconomic History  . Marital status: Married    Spouse name: Not on file  . Number of children: Not on file  . Years of education: Not on file  . Highest education level: Not on file  Occupational History  . Not on file  Social Needs  . Financial resource strain: Not on file  . Food insecurity:    Worry: Not on file    Inability: Not on file  . Transportation needs:    Medical: Not on  file    Non-medical: Not on file  Tobacco Use  . Smoking status: Never Smoker  . Smokeless tobacco: Never Used  Substance and Sexual Activity  . Alcohol use: No  . Drug use: No  . Sexual activity: Not on file  Lifestyle  . Physical activity:    Days per week: Not on file    Minutes per session: Not on file  . Stress: Not on file  Relationships  . Social connections:    Talks on phone: Not on file    Gets together:  Not on file    Attends religious service: Not on file    Active member of club or organization: Not on file    Attends meetings of clubs or organizations: Not on file    Relationship status: Not on file  . Intimate partner violence:    Fear of current or ex partner: Not on file    Emotionally abused: Not on file    Physically abused: Not on file    Forced sexual activity: Not on file  Other Topics Concern  . Not on file  Social History Narrative  . 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  FAMILY HISTORY: Patient is originally from [place]  Father deceased-from suicide Mother 67 year old  ALLERGIES:  has No Known Allergies.  MEDICATIONS:  Current Outpatient Medications  Medication Sig Dispense Refill  . Cholecalciferol (VITAMIN D PO) Take 1 tablet by mouth daily.    . CYANOCOBALAMIN PO Take 1 tablet by mouth daily.    [name] HYDROcodone-acetaminophen (NORCO/VICODIN) 5-325 MG tablet Take 1-2 tablets by mouth every 4 (four) hours as needed for moderate pain. 30 tablet 0  . levothyroxine (SYNTHROID, LEVOTHROID) 150 MCG tablet Take 150 mcg by mouth daily before breakfast.     . lisinopril-hydrochlorothiazide (PRINZIDE,ZESTORETIC) 10-12.5 MG tablet Take 1 tablet by mouth every morning.   0  . tamsulosin (FLOMAX) 0.4 MG CAPS capsule Take 0.4 mg by mouth at bedtime.    [name] VITAMIN A PO Take 1 tablet by mouth daily.     No current facility-administered medications for this visit.     REVIEW OF SYSTEMS:    A 10+ POINT REVIEW OF SYSTEMS WAS OBTAINED including neurology, dermatology, psychiatry, cardiac, respiratory, lymph, extremities, GI, GU, Musculoskeletal, constitutional, breasts, reproductive,  HEENT.  All pertinent positives are noted in the HPI.  All others are negative.   PHYSICAL EXAMINATION: ECOG PERFORMANCE STATUS: 0 - Asymptomatic  Vitals:   05/01/18 1156  BP: 103/78  Pulse: 88  Resp: 18  Temp: 98.4 F (36.9 C)  SpO2: 96%   Filed Weights   05/01/18 1156  Weight: 279 lb 8 oz (126.8 kg)   .Body mass index is 40.1 kg/m.  GENERAL:alert, in no acute distress and comfortable SKIN: no acute rashes, no significant lesions EYES: conjunctiva are pink and non-injected, sclera anicteric OROPHARYNX: MMM, no exudates, no oropharyngeal erythema or ulceration NECK: supple, no JVD LYMPH:  no palpable lymphadenopathy in the cervical, axillary or inguinal regions LUNGS: clear to auscultation b/l with normal respiratory effort HEART: regular rate & rhythm ABDOMEN:  normoactive bowel sounds , non tender, not distended. No palpable hepatosplenomegaly.  Extremity: no pedal edema  PSYCH: alert & oriented x 3 with fluent speech NEURO: no focal motor/sensory deficits   LABORATORY DATA:  I have reviewed the data as listed  . CBC Latest Ref Rng & Units 05/01/2018 10/31/2017 05/02/2017  WBC 4.0 - 10.5 K/uL 6.2 5.6 6.8  Hemoglobin 13.0 - 17.0 g/dL 17.0 16.5 17.0  Hematocrit 39.0 - 52.0 % 49.5 47.1 47.8  Platelets 150 - 400 K/uL 176 158 173   . CMP Latest Ref Rng & Units 05/01/2018 10/31/2017 05/02/2017  Glucose 70 - 99 mg/dL 86 113(H) 116  BUN 8 - 23 mg/dL 19 18 20   Creatinine 0.61 - 1.24 mg/dL 1.03 1.04 1.08  Sodium 135 - 145 mmol/L 141 140 139  Potassium 3.5 - 5.1 mmol/L 4.2 4.0 4.0  Chloride 98 - 111 mmol/L 107 107 106  CO2 22 - 32 mmol/L 25 27 24   Calcium 8.9 - 10.3 mg/dL 9.7 9.8 9.8  Total Protein 6.5 - 8.1 g/dL 7.5 7.3 7.5  Total Bilirubin 0.3 - 1.2 mg/dL 0.6 0.5 0.6  Alkaline Phos 38 - 126 U/L 73 68 79  AST 15 - 41 U/L 55(H) 43(H) 41(H)  ALT 0 - 44 U/L 101(H) 74(H) 71(H)   . Lab Results  Component Value Date   IRON 95 10/31/2017   TIBC 343 10/31/2017   IRONPCTSAT  28 (L) 10/31/2017   (Iron and TIBC)  Lab Results  Component Value Date   FERRITIN 130 05/01/2018           RADIOGRAPHIC STUDIES: I have personally reviewed the radiological images as listed and agreed with the findings in the report. No results found.  ASSESSMENT & PLAN:   63 y.o. 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 Patient's ferritin at baseline appears to be around 700's. It was down to the 46 after serial therapeutic phlebotomies and now is back up to 129 As noted about he was a blood donor for 15 years prior to 1997 which was probably somewhat protective. Most patients with isolated H63D carrier state did not develop overt clinical presentations of hemochromatosis. Patient does not report any overt joint issues. Has had no cardiac issues or cardiac arrhythmias. Has had elevated liver function tests attributable both to steatohepatitis.  #2 Elevated liver function tests Based on liver biopsy is predominantly from steatohepatitis with stage I fibrosis. No overt evidence of iron overload. Patient has a liver specialist that he is seen by Roosevelt Locks NP) LFTs stable.  PLAN: -Discussed pt labwork today, 05/01/18; blood counts normal, HGB at 17.0, chemistries stable -05/01/18 Ferritin is 130 -Proceed with therapeutic phlebotomy today and every 6 months -Goal for Ferritin <100 -continue follow up with Roosevelt Locks, NP for liver disease management -Counseled on minimizing or completely avoiding alcohol use. -Avoid other medications that could cause liver injury -f/u with PCP/GI for [place] Abd and AFP tumor markers q6 months for [place] screening -Will see the pt back in 12 months  -Continue with therapeutic phlebotomy and again in 6 months   #3 hypertension, dyslipidemia, hypothyroidism, sleep apnea, GERD -Continue management as per primary care physician   Please schedule therapeutic phlebotomy q76month x 4 with  labs RTC with Dr KIrene Limboin 12 months   All of the patients questions were answered with apparent satisfaction. The patient knows to call the clinic with any problems, questions or concerns.  The total time spent in the appt was 20 minutes and more than 50% was on counseling and direct patient  cares.    Sullivan Lone MD [place] AAHIVMS [facility] Hematology/Oncology Physician [place]  (Office):       [phone] (Work cell):  [phone] (Fax):           [phone]  05/01/2018 12:46 PM   I, Baldwin Jamaica, am acting as a scribe for Dr. Sullivan Lone.   .I have reviewed the above documentation for accuracy and completeness, and I agree with the above. Brunetta Genera MD

## 2018-05-01 ENCOUNTER — Inpatient Hospital Stay: Payer: Commercial Managed Care - PPO | Attending: Hematology

## 2018-05-01 ENCOUNTER — Inpatient Hospital Stay: Payer: Commercial Managed Care - PPO

## 2018-05-01 ENCOUNTER — Telehealth: Payer: Self-pay | Admitting: Hematology

## 2018-05-01 ENCOUNTER — Inpatient Hospital Stay: Payer: Commercial Managed Care - PPO | Admitting: Hematology

## 2018-05-01 DIAGNOSIS — R945 Abnormal results of liver function studies: Secondary | ICD-10-CM

## 2018-05-01 DIAGNOSIS — I10 Essential (primary) hypertension: Secondary | ICD-10-CM | POA: Diagnosis not present

## 2018-05-01 DIAGNOSIS — E039 Hypothyroidism, unspecified: Secondary | ICD-10-CM

## 2018-05-01 LAB — CBC WITH DIFFERENTIAL/PLATELET
Abs Immature Granulocytes: 0.02 10*3/uL (ref 0.00–0.07)
BASOS PCT: 1 %
Basophils Absolute: 0.1 10*3/uL (ref 0.0–0.1)
Eosinophils Absolute: 0.2 10*3/uL (ref 0.0–0.5)
Eosinophils Relative: 3 %
HCT: 49.5 % (ref 39.0–52.0)
Hemoglobin: 17 g/dL (ref 13.0–17.0)
Immature Granulocytes: 0 %
Lymphocytes Relative: 39 %
Lymphs Abs: 2.4 10*3/uL (ref 0.7–4.0)
MCH: 30.9 pg (ref 26.0–34.0)
MCHC: 34.3 g/dL (ref 30.0–36.0)
MCV: 89.8 fL (ref 80.0–100.0)
Monocytes Absolute: 1 10*3/uL (ref 0.1–1.0)
Monocytes Relative: 16 %
NEUTROS ABS: 2.6 10*3/uL (ref 1.7–7.7)
Neutrophils Relative %: 41 %
PLATELETS: 176 10*3/uL (ref 150–400)
RBC: 5.51 MIL/uL (ref 4.22–5.81)
RDW: 13.7 % (ref 11.5–15.5)
WBC: 6.2 10*3/uL (ref 4.0–10.5)
nRBC: 0 % (ref 0.0–0.2)

## 2018-05-01 LAB — CMP (CANCER CENTER ONLY)
ALT: 101 U/L — ABNORMAL HIGH (ref 0–44)
AST: 55 U/L — ABNORMAL HIGH (ref 15–41)
Albumin: 4.1 g/dL (ref 3.5–5.0)
Alkaline Phosphatase: 73 U/L (ref 38–126)
Anion gap: 9 (ref 5–15)
BUN: 19 mg/dL (ref 8–23)
CHLORIDE: 107 mmol/L (ref 98–111)
CO2: 25 mmol/L (ref 22–32)
Calcium: 9.7 mg/dL (ref 8.9–10.3)
Creatinine: 1.03 mg/dL (ref 0.61–1.24)
Glucose, Bld: 86 mg/dL (ref 70–99)
Potassium: 4.2 mmol/L (ref 3.5–5.1)
Sodium: 141 mmol/L (ref 135–145)
Total Bilirubin: 0.6 mg/dL (ref 0.3–1.2)
Total Protein: 7.5 g/dL (ref 6.5–8.1)

## 2018-05-01 LAB — FERRITIN: Ferritin: 130 ng/mL (ref 24–336)

## 2018-05-01 NOTE — Progress Notes (Signed)
Patient stated he was ready to leave after 15 minutes post observation. Vital signs taken and patient with no complaints.

## 2018-05-01 NOTE — Telephone Encounter (Signed)
Scheduled appt per 2/26 los.  Was only able to add three treatments due to the template nurse resource not be open that far out.  Printed calendar and avs.

## 2018-05-01 NOTE — Progress Notes (Signed)
Therapeutic phlebotomy performed per order. 544 units removed. Patient tolerated well. Drink and sandwich offered and patient accepted. No complaints. Patient will remain for 30 minute post observation.

## 2018-07-11 DIAGNOSIS — E063 Autoimmune thyroiditis: Secondary | ICD-10-CM | POA: Diagnosis not present

## 2018-07-11 DIAGNOSIS — E038 Other specified hypothyroidism: Secondary | ICD-10-CM | POA: Diagnosis not present

## 2018-07-16 DIAGNOSIS — E038 Other specified hypothyroidism: Secondary | ICD-10-CM | POA: Diagnosis not present

## 2018-07-16 DIAGNOSIS — E063 Autoimmune thyroiditis: Secondary | ICD-10-CM | POA: Diagnosis not present

## 2018-09-15 ENCOUNTER — Encounter (HOSPITAL_COMMUNITY): Payer: Self-pay

## 2018-09-15 ENCOUNTER — Other Ambulatory Visit: Payer: Self-pay

## 2018-09-15 ENCOUNTER — Emergency Department (HOSPITAL_COMMUNITY): Payer: Commercial Managed Care - PPO

## 2018-09-15 ENCOUNTER — Inpatient Hospital Stay (HOSPITAL_COMMUNITY)
Admission: EM | Admit: 2018-09-15 | Discharge: 2018-09-18 | DRG: 871 | Disposition: A | Discharge status: Home / Self Care | Payer: Commercial Managed Care - PPO | Attending: Internal Medicine | Admitting: Internal Medicine

## 2018-09-15 DIAGNOSIS — I10 Essential (primary) hypertension: Secondary | ICD-10-CM | POA: Diagnosis not present

## 2018-09-15 DIAGNOSIS — R0602 Shortness of breath: Secondary | ICD-10-CM | POA: Diagnosis present

## 2018-09-15 DIAGNOSIS — U071 COVID-19: Secondary | ICD-10-CM | POA: Diagnosis present

## 2018-09-15 DIAGNOSIS — A419 Sepsis, unspecified organism: Secondary | ICD-10-CM | POA: Diagnosis not present

## 2018-09-15 DIAGNOSIS — D696 Thrombocytopenia, unspecified: Secondary | ICD-10-CM | POA: Diagnosis present

## 2018-09-15 DIAGNOSIS — R748 Abnormal levels of other serum enzymes: Secondary | ICD-10-CM | POA: Diagnosis not present

## 2018-09-15 DIAGNOSIS — E559 Vitamin D deficiency, unspecified: Secondary | ICD-10-CM | POA: Diagnosis present

## 2018-09-15 DIAGNOSIS — A4189 Other specified sepsis: Secondary | ICD-10-CM | POA: Diagnosis not present

## 2018-09-15 DIAGNOSIS — R197 Diarrhea, unspecified: Secondary | ICD-10-CM | POA: Diagnosis present

## 2018-09-15 DIAGNOSIS — R6520 Severe sepsis without septic shock: Secondary | ICD-10-CM | POA: Diagnosis present

## 2018-09-15 DIAGNOSIS — J9601 Acute respiratory failure with hypoxia: Secondary | ICD-10-CM | POA: Diagnosis present

## 2018-09-15 DIAGNOSIS — R972 Elevated prostate specific antigen [PSA]: Secondary | ICD-10-CM | POA: Diagnosis present

## 2018-09-15 DIAGNOSIS — Z79899 Other long term (current) drug therapy: Secondary | ICD-10-CM

## 2018-09-15 DIAGNOSIS — J069 Acute upper respiratory infection, unspecified: Secondary | ICD-10-CM | POA: Diagnosis present

## 2018-09-15 DIAGNOSIS — E039 Hypothyroidism, unspecified: Secondary | ICD-10-CM | POA: Diagnosis present

## 2018-09-15 DIAGNOSIS — J189 Pneumonia, unspecified organism: Secondary | ICD-10-CM

## 2018-09-15 DIAGNOSIS — J1289 Other viral pneumonia: Secondary | ICD-10-CM | POA: Diagnosis present

## 2018-09-15 DIAGNOSIS — Z79890 Hormone replacement therapy (postmenopausal): Secondary | ICD-10-CM

## 2018-09-15 LAB — COMPREHENSIVE METABOLIC PANEL
ALT: 146 U/L — ABNORMAL HIGH (ref 0–44)
AST: 92 U/L — ABNORMAL HIGH (ref 15–41)
Albumin: 3.9 g/dL (ref 3.5–5.0)
Alkaline Phosphatase: 72 U/L (ref 38–126)
Anion gap: 10 (ref 5–15)
BUN: 18 mg/dL (ref 8–23)
CO2: 20 mmol/L — ABNORMAL LOW (ref 22–32)
Calcium: 8.3 mg/dL — ABNORMAL LOW (ref 8.9–10.3)
Chloride: 108 mmol/L (ref 98–111)
Creatinine, Ser: 1.12 mg/dL (ref 0.61–1.24)
GFR calc Af Amer: >60 mL/min (ref >60)
GFR calc non Af Amer: >60 mL/min (ref >60)
Glucose, Bld: 125 mg/dL — ABNORMAL HIGH (ref 70–99)
Potassium: 3.5 mmol/L (ref 3.5–5.1)
Sodium: 138 mmol/L (ref 135–145)
Total Bilirubin: 1.1 mg/dL (ref 0.3–1.2)
Total Protein: 7.4 g/dL (ref 6.5–8.1)

## 2018-09-15 LAB — BLOOD GAS, VENOUS
Acid-base deficit: 0.7 mmol/L (ref 0.0–2.0)
Bicarbonate: 21.1 mmol/L (ref 20.0–28.0)
FIO2: 21
O2 Saturation: 81.2 %
Patient temperature: 98.6
pCO2, Ven: 29.3 mmHg — ABNORMAL LOW (ref 44.0–60.0)
pH, Ven: 7.471 — ABNORMAL HIGH (ref 7.250–7.430)
pO2, Ven: 42.2 mmHg (ref 32.0–45.0)

## 2018-09-15 LAB — URINALYSIS, ROUTINE W REFLEX MICROSCOPIC
Bilirubin Urine: NEGATIVE
Glucose, UA: NEGATIVE mg/dL
Hgb urine dipstick: NEGATIVE
Ketones, ur: 5 mg/dL — AB
Leukocytes,Ua: NEGATIVE
Nitrite: NEGATIVE
Protein, ur: 30 mg/dL — AB
Specific Gravity, Urine: 1.027 (ref 1.005–1.030)
pH: 5 (ref 5.0–8.0)

## 2018-09-15 LAB — CBC WITH DIFFERENTIAL/PLATELET
Abs Immature Granulocytes: 0.02 10*3/uL (ref 0.00–0.07)
Basophils Absolute: 0 10*3/uL (ref 0.0–0.1)
Basophils Relative: 1 %
Eosinophils Absolute: 0 10*3/uL (ref 0.0–0.5)
Eosinophils Relative: 0 %
HCT: 50.3 % (ref 39.0–52.0)
Hemoglobin: 17.6 g/dL — ABNORMAL HIGH (ref 13.0–17.0)
Immature Granulocytes: 1 %
Lymphocytes Relative: 34 %
Lymphs Abs: 1.1 10*3/uL (ref 0.7–4.0)
MCH: 30.6 pg (ref 26.0–34.0)
MCHC: 35 g/dL (ref 30.0–36.0)
MCV: 87.5 fL (ref 80.0–100.0)
Monocytes Absolute: 0.4 10*3/uL (ref 0.1–1.0)
Monocytes Relative: 11 %
Neutro Abs: 1.7 10*3/uL (ref 1.7–7.7)
Neutrophils Relative %: 53 %
Platelets: 105 10*3/uL — ABNORMAL LOW (ref 150–400)
RBC: 5.75 MIL/uL (ref 4.22–5.81)
RDW: 13.3 % (ref 11.5–15.5)
WBC: 3.2 10*3/uL — ABNORMAL LOW (ref 4.0–10.5)
nRBC: 0 % (ref 0.0–0.2)

## 2018-09-15 LAB — SARS CORONAVIRUS 2 BY RT PCR (HOSPITAL ORDER, PERFORMED IN ~~LOC~~ HOSPITAL LAB): SARS Coronavirus 2: POSITIVE — AB

## 2018-09-15 LAB — LACTIC ACID, PLASMA: Lactic Acid, Venous: 1.4 mmol/L (ref 0.5–1.9)

## 2018-09-15 MED ORDER — LEVALBUTEROL TARTRATE 45 MCG/ACT IN AERO: 2.0000 | INHALATION_SPRAY | Freq: Four times a day (QID) | RESPIRATORY_TRACT | Status: DC | PRN

## 2018-09-15 MED ORDER — SODIUM CHLORIDE 0.9 % IV SOLN
1.0000 g | Freq: Once | INTRAVENOUS | Status: AC
  Administered 2018-09-15: 1 g via INTRAVENOUS
  Filled 2018-09-15: qty 10

## 2018-09-15 MED ORDER — SODIUM CHLORIDE 0.9 % IV SOLN
500.0000 mg | Freq: Once | INTRAVENOUS | Status: AC
  Administered 2018-09-15: 500 mg via INTRAVENOUS
  Filled 2018-09-15: qty 500

## 2018-09-15 MED ORDER — SODIUM CHLORIDE 0.9 % IV BOLUS
500.0000 mL | Freq: Once | INTRAVENOUS | Status: AC
  Administered 2018-09-15: 500 mL via INTRAVENOUS

## 2018-09-15 MED ORDER — METHYLPREDNISOLONE SODIUM SUCC 125 MG IJ SOLR
60.0000 mg | Freq: Two times a day (BID) | INTRAMUSCULAR | Status: DC
  Administered 2018-09-16 – 2018-09-18 (×6): 60 mg via INTRAVENOUS
  Filled 2018-09-15 (×6): qty 2

## 2018-09-15 MED ORDER — ACETAMINOPHEN 325 MG PO TABS
650.0000 mg | ORAL_TABLET | Freq: Once | ORAL | Status: AC
  Administered 2018-09-15: 650 mg via ORAL
  Filled 2018-09-15: qty 2

## 2018-09-15 MED ORDER — IPRATROPIUM BROMIDE HFA 17 MCG/ACT IN AERS
2.0000 | INHALATION_SPRAY | RESPIRATORY_TRACT | Status: DC
  Administered 2018-09-16: 2 via RESPIRATORY_TRACT
  Filled 2018-09-15: qty 12.9

## 2018-09-15 MED ORDER — OXYCODONE HCL 5 MG PO TABS: 5.0000 mg | ORAL_TABLET | Freq: Four times a day (QID) | ORAL | Status: DC | PRN

## 2018-09-15 MED ORDER — ACETAMINOPHEN 325 MG PO TABS: 650.0000 mg | ORAL_TABLET | Freq: Four times a day (QID) | ORAL | Status: DC | PRN

## 2018-09-15 MED ORDER — DM-GUAIFENESIN ER 30-600 MG PO TB12
1.0000 | ORAL_TABLET | Freq: Two times a day (BID) | ORAL | Status: DC | PRN
  Administered 2018-09-16: 1 via ORAL
  Filled 2018-09-15: qty 1

## 2018-09-15 NOTE — ED Notes (Signed)
Date and time results received: 09/15/18 8:48 PM  (use smartphrase ".now" to insert current time)  Test: SARS Covid 19 Critical Value: Positivie  Name of Provider Notified: Dr.Yao  Orders Received? Or Actions Taken?:none

## 2018-09-15 NOTE — Progress Notes (Signed)
VBG ran, but RT unable to put in Epic due to Lab being in patient chart; Copy given to Dr. Darl Householder.

## 2018-09-15 NOTE — ED Triage Notes (Signed)
He c/o dry, hacky cough plus aches x 10 days. He tells me his son recently tested positive for COVID and pt. Himself was tested two days ago "but the result isn't back yet". He is in no distress. RM air SPO2 92-95%.

## 2018-09-15 NOTE — ED Notes (Signed)
ED TO INPATIENT HANDOFF REPORT  Name/Age/Gender Bob Burton 63 y.o. male  Code Status Code Status History    Date Active Date Inactive Code Status Order ID Comments User Context   03/24/2017 2049 03/25/2017 1457 Full Code [id number]  Bob Kin, MD Inpatient   Advance Care Planning Activity      Home/SNF/Other Home  Chief Complaint Flu Like Symptoms   Level of Care/Admitting Diagnosis ED Disposition    ED Disposition Condition Comment   Admit  Hospital Area: [facility] [[id number]]  Level of Care: Telemetry [5]  Admit to tele based on following criteria: Other see comments  Comments: SOB  Covid Evaluation: Confirmed COVID Positive  Diagnosis: Acute respiratory disease due to COVID-19 virus [[id number]]  Admitting Physician: Lorretta Harp [[id number]]  Attending Physician: Lorretta Harp [[id number]]  PT Class (Do Not Modify): Observation [104]  PT Acc Code (Do Not Modify): Observation [10022]       Medical History Past Medical History:  Diagnosis Date  . Abnormal liver enzymes   . Dyslipidemia   . Elevated PSA   . Hyperlipemia   . Hypertension   . Hypothyroidism   . Vitamin D deficiency     Allergies No Known Allergies  IV Location/Drains/Wounds Patient Lines/Drains/Airways Status   Active Line/Drains/Airways    Name:   Placement date:   Placement time:   Site:   Days:   Peripheral IV 09/15/18 Left;Distal Forearm   09/15/18    1800    Forearm   less than 1   Peripheral IV 09/15/18 Left;Upper Forearm   09/15/18    1810    Forearm   less than 1          Labs/Imaging Results for orders placed or performed during the hospital encounter of 09/15/18 (from the past 48 hour(s))  SARS Coronavirus 2 (CEPHEID - Performed in [facility] hospital lab), Hosp Order     Status: Abnormal   Collection Time: 09/15/18  5:56 PM   Specimen: Nasopharyngeal Swab  Result Value Ref Range   SARS Coronavirus 2 POSITIVE (A) NEGATIVE    Comment: RESULT CALLED TO, READ  BACK BY AND VERIFIED WITH: HODGES,S. RN  ON 07.12.2020 BY COHEN,K (NOTE) If result is NEGATIVE SARS-CoV-2 target nucleic acids are NOT DETECTED. The SARS-CoV-2 RNA is generally detectable in upper and lower  respiratory specimens during the acute phase of infection. The lowest  concentration of SARS-CoV-2 viral copies this assay can detect is 250  copies / mL. A negative result does not preclude SARS-CoV-2 infection  and should not be used as the sole basis for treatment or other  patient management decisions.  A negative result may occur with  improper specimen collection / handling, submission of specimen other  than nasopharyngeal swab, presence of viral mutation(s) within the  areas targeted by this assay, and inadequate number of viral copies  (<250 copies / mL). A negative result must be combined with clinical  observations, patient history, and epidemiological information. If result is POSITIVE SARS-CoV-2 target nucleic acids are DETE CTED. The SARS-CoV-2 RNA is generally detectable in upper and lower  respiratory specimens during the acute phase of infection.  Positive  results are indicative of active infection with SARS-CoV-2.  Clinical  correlation with patient history and other diagnostic information is  necessary to determine patient infection status.  Positive results do  not rule out bacterial infection or co-infection with other viruses. If result is PRESUMPTIVE POSTIVE SARS-CoV-2 nucleic acids MAY BE PRESENT.  A presumptive positive result was obtained on the submitted specimen  and confirmed on repeat testing.  While 2019 novel coronavirus  (SARS-CoV-2) nucleic acids may be present in the submitted sample  additional confirmatory testing may be necessary for epidemiological  and / or clinical management purposes  to differentiate between  SARS-CoV-2 and other Sarbecovirus currently known to infect humans.  If clinically indicated additional testing with an  alternate test  methodology (LAB [id number]) is advised. The SARS-CoV-2 RNA is generally  detectable in upper and lower respiratory specimens during the acute  phase of infection. The expected result is Negative. Fact Sheet for Patients:  BoilerBrush.com.cyhttps://www.fda.gov/media/136312/download Fact Sheet for Healthcare Providers: https://pope.com/https://www.fda.gov/media/136313/download This test is not yet approved or cleared by the [place] FDA and has been authorized for detection and/or diagnosis of SARS-CoV-2 by FDA under an Emergency Use Authorization (EUA).  This EUA will remain in effect (meaning this test can be used) for the duration of the COVID-19 declaration under Section 564(b)(1) of the Act, 21 U.S.C. section 360bbb-3(b)(1), unless the authorization is terminated or revoked sooner. Performed at [facility], [address] [place], [place] [address]   Blood gas, venous (at [facility] and [facility], not at [facility])     Status: Abnormal   Collection Time: 09/15/18  6:00 PM  Result Value Ref Range   FIO2 21.00    Delivery systems ROOM AIR    pH, Ven 7.471 (H) 7.250 - 7.430   pCO2, Ven 29.3 (L) 44.0 - 60.0 mmHg   pO2, Ven 42.2 32.0 - 45.0 mmHg   Bicarbonate 21.1 20.0 - 28.0 mmol/L   Acid-base deficit 0.7 0.0 - 2.0 mmol/L   O2 Saturation 81.2 %   Patient temperature 98.6    Collection site VENOUS    Drawn by DRAWN BY RN    Sample type VENOUS     Comment: Performed at [facility], [address] [place], [place] [address]  CBC with Differential/Platelet     Status: Abnormal   Collection Time: 09/15/18  6:17 PM  Result Value Ref Range   WBC 3.2 (L) 4.0 - 10.5 K/uL   RBC 5.75 4.22 - 5.81 MIL/uL   Hemoglobin 17.6 (H) 13.0 - 17.0 g/dL   HCT 19.150.3 47.839.0 - 29.552.0 %   MCV 87.5 80.0 - 100.0 fL   MCH 30.6 26.0 - 34.0 pg   MCHC 35.0 30.0 - 36.0 g/dL   RDW 62.113.3 30.811.5 - 65.715.5 %   Platelets 105 (L) 150 - 400 K/uL    Comment: Immature Platelet Fraction may be clinically indicated,  consider ordering this additional test [id number]    nRBC 0.0 0.0 - 0.2 %   Neutrophils Relative % 53 %   Neutro Abs 1.7 1.7 - 7.7 K/uL   Lymphocytes Relative 34 %   Lymphs Abs 1.1 0.7 - 4.0 K/uL   Monocytes Relative 11 %   Monocytes Absolute 0.4 0.1 - 1.0 K/uL   Eosinophils Relative 0 %   Eosinophils Absolute 0.0 0.0 - 0.5 K/uL   Basophils Relative 1 %   Basophils Absolute 0.0 0.0 - 0.1 K/uL   Immature Granulocytes 1 %   Abs Immature Granulocytes 0.02 0.00 - 0.07 K/uL    Comment: Performed at [facility], [address] [place], [place] [address]  Comprehensive metabolic panel     Status: Abnormal   Collection Time: 09/15/18  6:17 PM  Result Value Ref Range   Sodium 138 135 - 145 mmol/L  Potassium 3.5 3.5 - 5.1 mmol/L   Chloride 108 98 - 111 mmol/L   CO2 20 (L) 22 - 32 mmol/L   Glucose, Bld 125 (H) 70 - 99 mg/dL   BUN 18 8 - 23 mg/dL   Creatinine, Ser 7.841.12 0.61 - 1.24 mg/dL   Calcium 8.3 (L) 8.9 - 10.3 mg/dL   Total Protein 7.4 6.5 - 8.1 g/dL   Albumin 3.9 3.5 - 5.0 g/dL   AST 92 (H) 15 - 41 U/L   ALT 146 (H) 0 - 44 U/L   Alkaline Phosphatase 72 38 - 126 U/L   Total Bilirubin 1.1 0.3 - 1.2 mg/dL   GFR calc non Af Amer >60 >60 mL/min   GFR calc Af Amer >60 >60 mL/min   Anion gap 10 5 - 15    Comment: Performed at [facility], [address] [place], [place] [address]  Lactic acid, plasma     Status: None   Collection Time: 09/15/18  6:17 PM  Result Value Ref Range   Lactic Acid, Venous 1.4 0.5 - 1.9 mmol/L    Comment: Performed at [facility], [address] [place], [place] [address]  Urinalysis, Routine w reflex microscopic     Status: Abnormal   Collection Time: 09/15/18  9:00 PM  Result Value Ref Range   Color, Urine AMBER (A) YELLOW    Comment: BIOCHEMICALS MAY BE AFFECTED BY COLOR   APPearance CLEAR CLEAR   Specific Gravity, Urine 1.027 1.005 - 1.030   pH 5.0 5.0 - 8.0   Glucose, UA NEGATIVE  NEGATIVE mg/dL   Hgb urine dipstick NEGATIVE NEGATIVE   Bilirubin Urine NEGATIVE NEGATIVE   Ketones, ur 5 (A) NEGATIVE mg/dL   Protein, ur 30 (A) NEGATIVE mg/dL   Nitrite NEGATIVE NEGATIVE   Leukocytes,Ua NEGATIVE NEGATIVE   RBC / HPF 0-5 0 - 5 RBC/hpf   WBC, UA 0-5 0 - 5 WBC/hpf   Bacteria, UA RARE (A) NONE SEEN   Squamous Epithelial / LPF 0-5 0 - 5   Mucus PRESENT     Comment: Performed at [facility], [address] [place], [place] [address]   Dg Chest Port 1 View  Result Date: 09/15/2018 CLINICAL DATA:  Cough. Patient recently tested positive for COVID-19. EXAM: PORTABLE CHEST 1 VIEW COMPARISON:  None. FINDINGS: Probable mild atelectasis in the bases. No convincing evidence of infiltrate. The cardiomediastinal silhouette is unremarkable. IMPRESSION: Probable atelectasis in the bases. No convincing evidence of focal infiltrate. Electronically Signed   By: Gerome Samavid  Williams III M.D   On: 09/15/2018 17:44    Pending Labs Unresulted Labs (From admission, onward)    Start     Ordered   09/15/18 2222  Influenza panel by PCR (type A & B)  Add-on,   AD     09/15/18 2222   09/15/18 2222  Respiratory Panel by PCR  (Respiratory virus panel with precautions)  Add-on,   AD     09/15/18 2222   09/15/18 1716  Urine culture  ONCE - STAT,   STAT     09/15/18 1715   09/15/18 1708  Blood culture (routine x 2)  BLOOD CULTURE X 2,   STAT     09/15/18 1707   Signed and Held  Lactic acid, plasma  STAT Now then every 3 hours,   STAT     Signed and Held   Signed and Held  Procalcitonin  ONCE - STAT,  R     Signed and Held   Signed and Held  HIV antibody (Routine Testing)  Once,   R     Signed and Held   Signed and Held  ABO/Rh  Once,   R     Signed and Held   Signed and Held  Type and screen [facility]  Once,   R    Comments: [facility]    Signed and Held   Signed and Held  Brain natriuretic peptide  Once,   R     Signed and Held    Signed and Held  C-reactive protein  Once,   R     Signed and Held   Medical illustratorigned and Held  D-dimer, quantitative (not at [facility])  Once,   R     Signed and Medical illustratorHeld   Signed and Held  Ferritin  Once,   R     Signed and Held   Signed and [employer]  Fibrinogen  Once,   R     Signed and Medical illustratorHeld   Signed and Held  Hepatitis B surface antigen  Once,   Primary school teacher     Signed and Held   Signed and Held  Interleukin-6, Plasma  Once,   R     Signed and Held   Medical illustratorigned and Held  Lactate dehydrogenase  Once,   R     Signed and Held   Medical illustratorigned and Held  Sedimentation rate  Once,   R     Signed and Held   Signed and Held  Triglycerides  Once,   R     Signed and Held   Signed and Held  CBC with Differential/Platelet  Daily,   R     Signed and Held   Medical illustratorigned and Held  Comprehensive metabolic panel  Daily,   R     Signed and Held   Signed and Held  C-reactive protein  Daily,   R     Signed and Held   Signed and Held  CK  Daily,   R     Signed and Held   Signed and Held  D-dimer, quantitative (not at [facility])  Daily,   R     Signed and Held   Signed and Held  Ferritin  Daily,   R     Signed and Held   Signed and Held  Interleukin-6, Plasma  Daily,   R     Signed and Held   Signed and Held  Triglycerides  Daily,   R     Signed and Held   Signed and Held  Strep pneumoniae urinary antigen  Once,   R     Signed and Held   Signed and Held  Legionella Pneumophila Serogp 1 Ur Ag  Once,   R     Signed and Held   Signed and Held  Culture, sputum-assessment  Once,   R     Signed and Held   Signed and Held  Save Smear  Once,   R     Signed and Held   Signed and Held  Pathologist smear review  Once,   R     Signed and Held          Vitals/Pain Today's Vitals   09/15/18 1727 09/15/18 1824 09/15/18 2020 09/15/18 2155  BP:  133/90 (!) 137/97 123/76  Pulse:  (!) 105 99 (!) 120  Resp:  (!) 21 (!) 24 (!) 26  Temp:  100.1 F (37.8 C)    TempSrc:  Oral    SpO2:  92% 94% 92%  PainSc: 0-No pain       Isolation Precautions Droplet  precaution  Medications Medications  azithromycin (ZITHROMAX) 500 mg in sodium chloride 0.9 % 250 mL IVPB (500 mg Intravenous New Bag/Given 09/15/18 2313)  ipratropium (ATROVENT HFA) inhaler 2 puff (has no administration in time range)  levalbuterol (XOPENEX HFA) inhaler 2 puff (has no administration in time range)  methylPREDNISolone sodium succinate (SOLU-MEDROL) 125 mg/2 mL injection 60 mg (has no administration in time range)  dextromethorphan-guaiFENesin (MUCINEX DM) 30-600 MG per 12 hr tablet 1 tablet (has no administration in time range)  acetaminophen (TYLENOL) tablet 650 mg (650 mg Oral Given 09/15/18 2145)  sodium chloride 0.9 % bolus 500 mL (0 mLs Intravenous Stopped 09/15/18 2316)  cefTRIAXone (ROCEPHIN) 1 g in sodium chloride 0.9 % 100 mL IVPB (0 g Intravenous Stopped 09/15/18 2316)  sodium chloride 0.9 % bolus 500 mL (500 mLs Intravenous New Bag/Given 09/15/18 2315)    Mobility walks

## 2018-09-15 NOTE — ED Provider Notes (Signed)
[facility]-EMERGENCY DEPT Provider Note   CSN: [id number] Arrival date & time: 09/15/18  1627    History   Chief Complaint Chief Complaint  Patient presents with  . URI    HPI Bob Burton is a 63 y.o. male history of hypertension, hyperlipidemia, here presenting with fever, shortness of breath.  Patient states that his son recently tested positive for coronavirus.  [place] [place] and tested positive and came back about a week ago.  He states that for the last week or so, he has been having some sore throat and worsening shortness of breath.  He has been running persistent fevers about T-max of 104 daily.  He was tested for COVID 2 days ago with results pending.  He states that his symptoms has worsening and he has more subjective shortness of breath so came here for evaluation.  Patient has no history of lung disease and is not a smoker.     The history is provided by the patient.    Past Medical History:  Diagnosis Date  . Abnormal liver enzymes   . Dyslipidemia   . Elevated PSA   . Hyperlipemia   . Hypertension   . Hypothyroidism   . Vitamin D deficiency     Patient Active Problem List   Diagnosis Date Noted  . Acute hemorrhagic cholecystitis s/p lap cholecystectomy 03/25/2017 03/24/2017  . Hypothyroidism 12/21/2015  . Dyslipidemia 12/21/2015  . Obstructive sleep apnea 12/21/2015  . Low back pain 12/21/2015  . Dermatophytosis 12/21/2015  . Gout 12/21/2015  . Elevated liver enzymes 12/21/2015  . Elevated PSA 12/21/2015  . Hereditary hemochromatosis (HCC) 12/21/2015  . Abnormal liver function 12/21/2015    Past Surgical History:  Procedure Laterality Date  . CHOLECYSTECTOMY N/A 03/25/2017   Procedure: LAPAROSCOPIC CHOLECYSTECTOMY WITH INTRAOPERATIVE CHOLANGIOGRAM;  Surgeon: Ovidio KinNewman, India Jolin, MD;  Location: [facility];  Service: General;  Laterality: N/A;        Home Medications    Prior to Admission medications    Medication Sig Start Date End Date Taking? Authorizing Provider  Cholecalciferol (VITAMIN D PO) Take 1 tablet by mouth daily.    [provider]  CYANOCOBALAMIN PO Take 1 tablet by mouth daily.    [provider]  HYDROcodone-acetaminophen (NORCO/VICODIN) 5-325 MG tablet Take 1-2 tablets by mouth every 4 (four) hours as needed for moderate pain. 03/26/17   Luretha MurphyMartin, Matthew, MD  levothyroxine (SYNTHROID, LEVOTHROID) 150 MCG tablet Take 150 mcg by mouth daily before breakfast.     [provider]  lisinopril-hydrochlorothiazide (PRINZIDE,ZESTORETIC) 10-12.5 MG tablet Take 1 tablet by mouth every morning.  11/04/15   [provider]  tamsulosin (FLOMAX) 0.4 MG CAPS capsule Take 0.4 mg by mouth at bedtime.    [provider]  VITAMIN A PO Take 1 tablet by mouth daily.    [provider]    Family History No family history on file.  Social History Social History   Tobacco Use  . Smoking status: Never Smoker  . Smokeless tobacco: Never Used  Substance Use Topics  . Alcohol use: No  . Drug use: No     Allergies   Patient has no known allergies.   Review of Systems Review of Systems  Constitutional: Positive for chills and fever.  Respiratory: Positive for cough and shortness of breath.   All other systems reviewed and are negative.    Physical Exam Updated Vital Signs BP 123/76   Pulse [name](!)  120   Temp 100.1 F (37.8 C) (Oral)   Resp (!) 26   SpO2 92%   Physical Exam Vitals signs and nursing note reviewed.  HENT:     Head: Normocephalic.     Nose: Nose normal.     Mouth/Throat:     Mouth: Mucous membranes are dry.  Eyes:     Extraocular Movements: Extraocular movements intact.     Pupils: Pupils are equal, round, and reactive to light.  Neck:     Musculoskeletal: Normal range of motion.  Cardiovascular:     Rate and Rhythm: Normal rate and regular rhythm.     Pulses: Normal pulses.     Heart sounds: Normal heart  sounds.  Pulmonary:     Effort: Pulmonary effort is normal.     Breath sounds: Normal breath sounds.  Abdominal:     General: Abdomen is flat.     Palpations: Abdomen is soft.  Musculoskeletal: Normal range of motion.  Skin:    General: Skin is warm.     Capillary Refill: Capillary refill takes less than 2 seconds.  Neurological:     General: No focal deficit present.     Mental Status: He is alert and oriented to person, place, and time. Mental status is at baseline.  Psychiatric:        Mood and Affect: Mood normal.      ED Treatments / Results  Labs (all labs ordered are listed, but only abnormal results are displayed) Labs Reviewed  SARS CORONAVIRUS 2 (HOSPITAL ORDER, [place] [facility]) - Abnormal; Notable for the following components:      Result Value   SARS Coronavirus 2 POSITIVE (*)    All other components within normal limits  CBC WITH DIFFERENTIAL/PLATELET - Abnormal; Notable for the following components:   WBC 3.2 (*)    Hemoglobin 17.6 (*)    Platelets 105 (*)    All other components within normal limits  COMPREHENSIVE METABOLIC PANEL - Abnormal; Notable for the following components:   CO2 20 (*)    Glucose, Bld 125 (*)    Calcium 8.3 (*)    AST 92 (*)    ALT 146 (*)    All other components within normal limits  BLOOD GAS, VENOUS - Abnormal; Notable for the following components:   pH, Ven 7.471 (*)    pCO2, Ven 29.3 (*)    All other components within normal limits  CULTURE, BLOOD (ROUTINE X 2)  CULTURE, BLOOD (ROUTINE X 2)  URINE CULTURE  LACTIC ACID, PLASMA  URINALYSIS, ROUTINE W REFLEX MICROSCOPIC    EKG EKG Interpretation  Date/Time:  Sunday September 15 2018 18:24:16 EDT Ventricular Rate:  101 PR Interval:    QRS Duration: 96 QT Interval:  341 QTC Calculation: 442 R Axis:   -112 Text Interpretation:  Sinus tachycardia Ventricular premature complex Left anterior fascicular block Low voltage, precordial leads Abnormal R-wave  progression, late transition Borderline T wave abnormalities No significant change since last tracing Confirmed by Wandra Arthurs ([id number]) on 09/15/2018 8:49:04 PM   Radiology Dg Chest Port 1 View  Result Date: 09/15/2018 CLINICAL DATA:  Cough. Patient recently tested positive for COVID-19. EXAM: PORTABLE CHEST 1 VIEW COMPARISON:  None. FINDINGS: Probable mild atelectasis in the bases. No convincing evidence of infiltrate. The cardiomediastinal silhouette is unremarkable. IMPRESSION: Probable atelectasis in the bases. No convincing evidence of focal infiltrate. Electronically Signed   By: Dorise Bullion III M.D   On: 09/15/2018  17:44    Procedures Procedures (including critical care time)    Medications Ordered in ED Medications  cefTRIAXone (ROCEPHIN) 1 g in sodium chloride 0.9 % 100 mL IVPB (1 g Intravenous New Bag/Given 09/15/18 2147)  azithromycin (ZITHROMAX) 500 mg in sodium chloride 0.9 % 250 mL IVPB (has no administration in time range)  acetaminophen (TYLENOL) tablet 650 mg (650 mg Oral Given 09/15/18 2145)  sodium chloride 0.9 % bolus 500 mL (500 mLs Intravenous New Bag/Given 09/15/18 2148)     Initial Impression / Assessment and Plan / ED Course  I have reviewed the triage vital signs and the nursing notes.  Pertinent labs & imaging results that were available during my care of the patient were reviewed by me and considered in my medical decision making (see chart for details).       Bob Burton is a 63 y.o. male here with SOB. Son was tested positive for COVID. He has low grade temp, tachycardia, borderline oxygen level on arrival. Consider pneumonia vs COVID. Will get labs, lactate, cultures, CXR.   10:13 PM Labs showed low WBC count. Minimally elevated LFTs. COVID positive. CXR showed atelectasis.  Oxygen remained about 191 to 92% and tachypneic to the mid 20s.  Patient also still tachycardic to about 120.  He was able to ambulate but does get very tachypneic as  well.  Given antibiotics for possible early pneumonia.  Will consult for admission.   Final Clinical Impressions(s) / ED Diagnoses   Final diagnoses:  None    ED Discharge Orders    None       Charlynne PanderYao, Jahsiah Carpenter Hsienta, MD 09/15/18 2217

## 2018-09-15 NOTE — H&P (Addendum)
History and Physical    Bob Burton:[id number] DOB: 11/14/1955 DOA: 09/15/2018  Referring MD/NP/PA:   PCP: Aura Dials, MD   Patient coming from:  The patient is coming from home.  At baseline, pt is independent for most of ADL.        Chief Complaint: Fever, chills, cough, shortness of breath, chest pain  HPI: Bob Burton is a 63 y.o. male with medical history significant of hypertension, hyperlipidemia, hypothyroidism, gout, hereditary hemochromatosis, who presents with fever, chills, cough, shortness of breath and chest pain.  Patient states that he has been having fever, chills, dry cough, shortness of breath for almost 10 days.  He has fever 101 at home.  He also has pleuritic chest pain, which is located in central chest, moderate, nonradiating, preventing him taking deep breaths.  Denies runny nose or sore throat.  Patient has some mild intermittent diarrhea recently.  He also has nausea, but no vomiting or abdominal pain.  Denies symptoms of UTI or unilateral weakness.  Of note, patient was recently tested positive for COVID-19.   ED Course: pt was found to have positive COVID-19 test, WBC 3.2, lactic acid 1.4, abnormal liver function (ALP 72, AST 92, ALT 146, total bilirubin 1.1), electrolytes renal function okay, temperature 100.1, blood pressure 123/76, tachycardia, tachypnea, oxygen saturation 92 to 94% on room air.  CXR showed probable atelectasis in the bases, but not convincing evidence of focal infiltrate. Pt is placed on telemetry bed for observation.  Review of Systems:   General: has fevers, chills, no body weight gain, has poor appetite, has fatigue HEENT: no blurry vision, hearing changes or sore throat Respiratory: has dyspnea, coughing, no wheezing CV: has chest pain, no palpitations GI: has nausea, diarrhea, no constipation, vomiting, abdominal pain, GU: no dysuria, burning on urination, increased urinary frequency, hematuria  Ext: no leg  edema Neuro: no unilateral weakness, numbness, or tingling, no vision change or hearing loss Skin: no rash, no skin tear. MSK: No muscle spasm, no deformity, no limitation of range of movement in spin Heme: No easy bruising.  Travel history: No recent long distant travel.  Allergy: No Known Allergies  Past Medical History:  Diagnosis Date   Abnormal liver enzymes    Dyslipidemia    Elevated PSA    Hyperlipemia    Hypertension    Hypothyroidism    Vitamin D deficiency     Past Surgical History:  Procedure Laterality Date   CHOLECYSTECTOMY N/A 03/25/2017   Procedure: LAPAROSCOPIC CHOLECYSTECTOMY WITH INTRAOPERATIVE CHOLANGIOGRAM;  Surgeon: Alphonsa Overall, MD;  Location: [facility];  Service: General;  Laterality: N/A;    Social History:  reports that he has never smoked. He has never used smokeless tobacco. He reports that he does not drink alcohol or use drugs.  Family History: Reviewed with patient, the patient states that all family members are healthy per his knowledge.   Prior to Admission medications   Medication Sig Start Date End Date Taking? Authorizing Provider  Cholecalciferol (VITAMIN D PO) Take 1 tablet by mouth daily.    [provider]  CYANOCOBALAMIN PO Take 1 tablet by mouth daily.    [provider]  HYDROcodone-acetaminophen (NORCO/VICODIN) 5-325 MG tablet Take 1-2 tablets by mouth every 4 (four) hours as needed for moderate pain. 03/26/17   Johnathan Hausen, MD  levothyroxine (SYNTHROID, LEVOTHROID) 150 MCG tablet Take 150 mcg by mouth daily before breakfast.     [provider]  lisinopril-hydrochlorothiazide (PRINZIDE,ZESTORETIC) 10-12.5 MG tablet Take  1 tablet by mouth every morning.  11/04/15   [provider]  tamsulosin (FLOMAX) 0.4 MG CAPS capsule Take 0.4 mg by mouth at bedtime.    [provider]  VITAMIN A PO Take 1 tablet by mouth daily.    [provider]    Physical Exam: Vitals:   09/15/18  1649 09/15/18 1824 09/15/18 2020 09/15/18 2155  BP: 137/88 133/90 (!) 137/97 123/76  Pulse: (!) 54 (!) 105 99 (!) 120  Resp: 18 (!) 21 (!) 24 (!) 26  Temp: 100 F (37.8 C) 100.1 F (37.8 C)    TempSrc: Oral Oral    SpO2: 94% 92% 94% 92%   General: Not in acute distress HEENT:       Eyes: PERRL, EOMI, no scleral icterus.       ENT: No discharge from the ears and nose, no pharynx injection, no tonsillar enlargement.        Neck: No JVD, no bruit, no mass felt. Heme: No neck lymph node enlargement. Cardiac: S1/S2, RRR, No murmurs, No gallops or rubs. Respiratory: Has coarse breathing sound.  No rales, wheezing, rhonchi or rubs. GI: Soft, nondistended, nontender, no rebound pain, no organomegaly, BS present. GU: No hematuria Ext: No pitting leg edema bilaterally. 2+DP/PT pulse bilaterally. Musculoskeletal: No joint deformities, No joint redness or warmth, no limitation of ROM in spin. Skin: No rashes.  Neuro: Alert, oriented X3, cranial nerves II-XII grossly intact, moves all extremities normally.  Psych: Patient is not psychotic, no suicidal or hemocidal ideation.  Labs on Admission: I have personally reviewed following labs and imaging studies  CBC: Recent Labs  Lab 09/15/18 1817  WBC 3.2*  NEUTROABS 1.7  HGB 17.6*  HCT 50.3  MCV 87.5  PLT 458*   Basic Metabolic Panel: Recent Labs  Lab 09/15/18 1817  NA 138  K 3.5  CL 108  CO2 20*  GLUCOSE 125*  BUN 18  CREATININE 1.12  CALCIUM 8.3*   GFR: CrCl cannot be calculated (Unknown ideal weight.). Liver Function Tests: Recent Labs  Lab 09/15/18 1817  AST 92*  ALT 146*  ALKPHOS 72  BILITOT 1.1  PROT 7.4  ALBUMIN 3.9   No results for input(s): LIPASE, AMYLASE in the last 168 hours. No results for input(s): AMMONIA in the last 168 hours. Coagulation Profile: No results for input(s): INR, PROTIME in the last 168 hours. Cardiac Enzymes: No results for input(s): CKTOTAL, CKMB, CKMBINDEX, TROPONINI in the last 168  hours. BNP (last 3 results) No results for input(s): PROBNP in the last 8760 hours. HbA1C: No results for input(s): HGBA1C in the last 72 hours. CBG: No results for input(s): GLUCAP in the last 168 hours. Lipid Profile: No results for input(s): CHOL, HDL, LDLCALC, TRIG, CHOLHDL, LDLDIRECT in the last 72 hours. Thyroid Function Tests: No results for input(s): TSH, T4TOTAL, FREET4, T3FREE, THYROIDAB in the last 72 hours. Anemia Panel: No results for input(s): VITAMINB12, FOLATE, FERRITIN, TIBC, IRON, RETICCTPCT in the last 72 hours. Urine analysis:    Component Value Date/Time   COLORURINE AMBER (A) 09/15/2018 2100   APPEARANCEUR CLEAR 09/15/2018 2100   LABSPEC 1.027 09/15/2018 2100   PHURINE 5.0 09/15/2018 2100   GLUCOSEU NEGATIVE 09/15/2018 2100   HGBUR NEGATIVE 09/15/2018 2100   BILIRUBINUR NEGATIVE 09/15/2018 2100   KETONESUR 5 (A) 09/15/2018 2100   PROTEINUR 30 (A) 09/15/2018 2100   NITRITE NEGATIVE 09/15/2018 2100   LEUKOCYTESUR NEGATIVE 09/15/2018 2100   Sepsis Labs: @LABRCNTIP (procalcitonin:4,lacticidven:4) ) Recent Results (from the  past 240 hour(s))  SARS Coronavirus 2 (CEPHEID - Performed in [place] hospital lab), Hosp Order     Status: Abnormal   Collection Time: 09/15/18  5:56 PM   Specimen: Nasopharyngeal Swab  Result Value Ref Range Status   SARS Coronavirus 2 POSITIVE (A) NEGATIVE Final    Comment: RESULT CALLED TO, READ BACK BY AND VERIFIED WITH: HODGES,S. RN @2047  ON 07.12.2020 BY COHEN,K (NOTE) If result is NEGATIVE SARS-CoV-2 target nucleic acids are NOT DETECTED. The SARS-CoV-2 RNA is generally detectable in upper and lower  respiratory specimens during the acute phase of infection. The lowest  concentration of SARS-CoV-2 viral copies this assay can detect is 250  copies / mL. A negative result does not preclude SARS-CoV-2 infection  and should not be used as the sole basis for treatment or other  patient management decisions.  A negative result  may occur with  improper specimen collection / handling, submission of specimen other  than nasopharyngeal swab, presence of viral mutation(s) within the  areas targeted by this assay, and inadequate number of viral copies  (<250 copies / mL). A negative result must be combined with clinical  observations, patient history, and epidemiological information. If result is POSITIVE SARS-CoV-2 target nucleic acids are DETE CTED. The SARS-CoV-2 RNA is generally detectable in upper and lower  respiratory specimens during the acute phase of infection.  Positive  results are indicative of active infection with SARS-CoV-2.  Clinical  correlation with patient history and other diagnostic information is  necessary to determine patient infection status.  Positive results do  not rule out bacterial infection or co-infection with other viruses. If result is PRESUMPTIVE POSTIVE SARS-CoV-2 nucleic acids MAY BE PRESENT.   A presumptive positive result was obtained on the submitted specimen  and confirmed on repeat testing.  While 2019 novel coronavirus  (SARS-CoV-2) nucleic acids may be present in the submitted sample  additional confirmatory testing may be necessary for epidemiological  and / or clinical management purposes  to differentiate between  SARS-CoV-2 and other Sarbecovirus currently known to infect humans.  If clinically indicated additional testing with an alternate test  methodology (LAB [phone]) is advised. The SARS-CoV-2 RNA is generally  detectable in upper and lower respiratory specimens during the acute  phase of infection. The expected result is Negative. Fact Sheet for Patients:  StrictlyIdeas.no Fact Sheet for Healthcare Providers: BankingDealers.co.za This test is not yet approved or cleared by the [place] FDA and has been authorized for detection and/or diagnosis of SARS-CoV-2 by FDA under an Emergency Use Authorization (EUA).   This EUA will remain in effect (meaning this test can be used) for the duration of the COVID-19 declaration under Section 564(b)(1) of the Act, 21 U.S.C. section 360bbb-3(b)(1), unless the authorization is terminated or revoked sooner. Performed at [facility], [place] [address] [place], [place] [address]      Radiological Exams on Admission: Dg Chest Port 1 View  Result Date: 09/15/2018 CLINICAL DATA:  Cough. Patient recently tested positive for COVID-19. EXAM: PORTABLE CHEST 1 VIEW COMPARISON:  None. FINDINGS: Probable mild atelectasis in the bases. No convincing evidence of infiltrate. The cardiomediastinal silhouette is unremarkable. IMPRESSION: Probable atelectasis in the bases. No convincing evidence of focal infiltrate. Electronically Signed   By: Dorise Bullion III M.D   On: 09/15/2018 17:44     EKG: Independently reviewed.  Sinus rhythm, tachycardia, occasional PVC, LAD, poor R wave progression, T wave inversion in lead III.   Assessment/Plan Principal  Problem:   Acute respiratory disease due to COVID-19 virus Active Problems:   Hypothyroidism   Elevated liver enzymes   Hereditary hemochromatosis ([place])   HTN (hypertension)   Thrombocytopenia ([place])   Sepsis ([place])  Sepsis 2/2 acute respiratory disease due to COVID-19 virus: Patient admits criteria for sepsis with WBC 32 (<4.0), fever, tachycardia and tachypnea.  Oxygen saturation 92 to 94% on room air, but the chest x-ray did not show convincing infiltration. Pt does not meet criteria for using Remdesivir. Patient's presentation is typical for COVID-19 infection, low suspicions for bacterial pneumonia.  Patient received 1 dose of Rocephin and azithromycin in ED, will not continue at this moment.    -will  Place on tele bed for obs -Solumedrol 60 mg bid -give zinc.   -will not give Vitamin C since patient has hereditary hemochromatosis and vitamin C may increase iron absorption -Atrovent inhaler, PRN  Xopenex or albuterol inhaler -PRN Mucinex for cough -Follow-up flu PCR and RVP -f/u Blood culture -Gentle IV fluid: NS 500 cc x 2 -D-dimer, BNP,Trop, LFT, CRP, LDH, Procalcitonin, IL-6, ferritin, fibinogen, TG, Hep B SAg, HIV ab -Daily CRP, Ferritin, D-dimer, -Will ask the patient to maintain an awake prone position for 16+ hours a day, if possible, with a minimum of 2-3 hours at a time -Will attempt to maintain euvolemia to a net negative fluid status -Patient was seen wearing full PPE including: gown, gloves, head cover, N95 -IF patient deteriorates, will consult PCCM and ID  Hypothyroidism: -synthroid  Elevated liver enzymes: likely due to COVID-19 infection. Pt also has hx of abnormal liver function which is likely due to hemochromatosis.  AST 92, ALT 146, total bilirubin 1.1, ALP 72. -Follow-up HIV antibody and hepatitis B antigen -use tylenol carefully only for fever (pt cannot not use NSIADs due to Covid19 infection)  HTN (hypertension): -hold home Prinzide since patient at high risk of developing hypotension due to sepsis. -As needed hydralazine IV  Hereditary hemochromatosis: pt is getting phlebotomy treatment, last treatment was on 05/01/2018 followed up with Dr. Irene Limbo. His Hgb is 17.6 today. -f/u with hematologist.  Thrombocytopenia [facility]): Platelet 105, likely due to ongoing infection.  Mental status normal. -check peripheral smear and LDH - Follow-up with CBC    DVT ppx: SQ Lovenox Code Status: Full code Family Communication: None at bed side.     Disposition Plan:  Anticipate discharge back to previous home environment Consults called:  none Admission status: Obs / tele     Date of Service 09/15/2018    [facility]   If 7PM-7AM, please contact night-coverage [email or web address] Password [facility] 09/15/2018, 11:36 PM

## 2018-09-15 NOTE — ED Notes (Signed)
Attempted to call report to floor, floor nurse said she will call back in 20 min.

## 2018-09-15 NOTE — ED Notes (Signed)
Note: while ambulating his SPO2 was 93% as we began walking; and it was 100% when we finished our short walk. (Rm. Air).

## 2018-09-16 ENCOUNTER — Encounter (HOSPITAL_COMMUNITY): Payer: Self-pay | Admitting: *Deleted

## 2018-09-16 DIAGNOSIS — E039 Hypothyroidism, unspecified: Secondary | ICD-10-CM | POA: Diagnosis present

## 2018-09-16 DIAGNOSIS — Z79899 Other long term (current) drug therapy: Secondary | ICD-10-CM | POA: Diagnosis not present

## 2018-09-16 DIAGNOSIS — R748 Abnormal levels of other serum enzymes: Secondary | ICD-10-CM | POA: Diagnosis not present

## 2018-09-16 DIAGNOSIS — J9601 Acute respiratory failure with hypoxia: Secondary | ICD-10-CM | POA: Diagnosis present

## 2018-09-16 DIAGNOSIS — J181 Lobar pneumonia, unspecified organism: Secondary | ICD-10-CM | POA: Diagnosis not present

## 2018-09-16 DIAGNOSIS — D696 Thrombocytopenia, unspecified: Secondary | ICD-10-CM | POA: Diagnosis present

## 2018-09-16 DIAGNOSIS — R972 Elevated prostate specific antigen [PSA]: Secondary | ICD-10-CM | POA: Diagnosis present

## 2018-09-16 DIAGNOSIS — R0602 Shortness of breath: Secondary | ICD-10-CM | POA: Diagnosis present

## 2018-09-16 DIAGNOSIS — R6520 Severe sepsis without septic shock: Secondary | ICD-10-CM | POA: Diagnosis present

## 2018-09-16 DIAGNOSIS — J069 Acute upper respiratory infection, unspecified: Secondary | ICD-10-CM | POA: Diagnosis not present

## 2018-09-16 DIAGNOSIS — I10 Essential (primary) hypertension: Secondary | ICD-10-CM | POA: Diagnosis present

## 2018-09-16 DIAGNOSIS — R197 Diarrhea, unspecified: Secondary | ICD-10-CM | POA: Diagnosis present

## 2018-09-16 DIAGNOSIS — A4189 Other specified sepsis: Secondary | ICD-10-CM | POA: Diagnosis present

## 2018-09-16 DIAGNOSIS — U071 COVID-19: Secondary | ICD-10-CM | POA: Diagnosis present

## 2018-09-16 DIAGNOSIS — J1289 Other viral pneumonia: Secondary | ICD-10-CM | POA: Diagnosis present

## 2018-09-16 DIAGNOSIS — Z79890 Hormone replacement therapy (postmenopausal): Secondary | ICD-10-CM | POA: Diagnosis not present

## 2018-09-16 DIAGNOSIS — E559 Vitamin D deficiency, unspecified: Secondary | ICD-10-CM | POA: Diagnosis present

## 2018-09-16 LAB — INFLUENZA PANEL BY PCR (TYPE A & B)
Influenza A By PCR: NEGATIVE
Influenza B By PCR: NEGATIVE

## 2018-09-16 LAB — COMPREHENSIVE METABOLIC PANEL
ALT: 127 U/L — ABNORMAL HIGH (ref 0–44)
AST: 80 U/L — ABNORMAL HIGH (ref 15–41)
Albumin: 3.5 g/dL (ref 3.5–5.0)
Alkaline Phosphatase: 65 U/L (ref 38–126)
Anion gap: 12 (ref 5–15)
BUN: 19 mg/dL (ref 8–23)
CO2: 17 mmol/L — ABNORMAL LOW (ref 22–32)
Calcium: 8 mg/dL — ABNORMAL LOW (ref 8.9–10.3)
Chloride: 110 mmol/L (ref 98–111)
Creatinine, Ser: 0.96 mg/dL (ref 0.61–1.24)
GFR calc Af Amer: >60 mL/min (ref >60)
GFR calc non Af Amer: >60 mL/min (ref >60)
Glucose, Bld: 158 mg/dL — ABNORMAL HIGH (ref 70–99)
Potassium: 3.9 mmol/L (ref 3.5–5.1)
Sodium: 139 mmol/L (ref 135–145)
Total Bilirubin: 0.7 mg/dL (ref 0.3–1.2)
Total Protein: 6.8 g/dL (ref 6.5–8.1)

## 2018-09-16 LAB — PATHOLOGIST SMEAR REVIEW

## 2018-09-16 LAB — CBC WITH DIFFERENTIAL/PLATELET
Abs Immature Granulocytes: 0.02 10*3/uL (ref 0.00–0.07)
Basophils Absolute: 0 10*3/uL (ref 0.0–0.1)
Basophils Relative: 1 %
Eosinophils Absolute: 0 10*3/uL (ref 0.0–0.5)
Eosinophils Relative: 0 %
HCT: 50.5 % (ref 39.0–52.0)
Hemoglobin: 17.4 g/dL — ABNORMAL HIGH (ref 13.0–17.0)
Immature Granulocytes: 1 %
Lymphocytes Relative: 31 %
Lymphs Abs: 0.9 10*3/uL (ref 0.7–4.0)
MCH: 30.8 pg (ref 26.0–34.0)
MCHC: 34.5 g/dL (ref 30.0–36.0)
MCV: 89.4 fL (ref 80.0–100.0)
Monocytes Absolute: 0.2 10*3/uL (ref 0.1–1.0)
Monocytes Relative: 6 %
Neutro Abs: 1.8 10*3/uL (ref 1.7–7.7)
Neutrophils Relative %: 61 %
Platelets: 99 10*3/uL — ABNORMAL LOW (ref 150–400)
RBC: 5.65 MIL/uL (ref 4.22–5.81)
RDW: 13.5 % (ref 11.5–15.5)
WBC: 2.9 10*3/uL — ABNORMAL LOW (ref 4.0–10.5)
nRBC: 0 % (ref 0.0–0.2)

## 2018-09-16 LAB — PROCALCITONIN: Procalcitonin: 0.14 ng/mL

## 2018-09-16 LAB — RESPIRATORY PANEL BY PCR

## 2018-09-16 LAB — ABO/RH: ABO/RH(D): O NEG

## 2018-09-16 LAB — STREP PNEUMONIAE URINARY ANTIGEN: Strep Pneumo Urinary Antigen: NEGATIVE

## 2018-09-16 LAB — BRAIN NATRIURETIC PEPTIDE: B Natriuretic Peptide: 13.9 pg/mL (ref 0.0–100.0)

## 2018-09-16 LAB — TRIGLYCERIDES: Triglycerides: 113 mg/dL (ref <150)

## 2018-09-16 LAB — SAVE SMEAR(SSMR), FOR PROVIDER SLIDE REVIEW

## 2018-09-16 LAB — LACTIC ACID, PLASMA
Lactic Acid, Venous: 1.2 mmol/L (ref 0.5–1.9)
Lactic Acid, Venous: 1.7 mmol/L (ref 0.5–1.9)

## 2018-09-16 LAB — LACTATE DEHYDROGENASE: LDH: 261 U/L — ABNORMAL HIGH (ref 98–192)

## 2018-09-16 LAB — TYPE AND SCREEN
ABO/RH(D): O NEG
Antibody Screen: NEGATIVE

## 2018-09-16 LAB — CK: Total CK: 300 U/L (ref 49–397)

## 2018-09-16 LAB — C-REACTIVE PROTEIN: CRP: 2.6 mg/dL — ABNORMAL HIGH (ref <1.0)

## 2018-09-16 LAB — D-DIMER, QUANTITATIVE: D-Dimer, Quant: 0.53 ug/mL-FEU — ABNORMAL HIGH (ref 0.00–0.50)

## 2018-09-16 LAB — TROPONIN I (HIGH SENSITIVITY): Troponin I (High Sensitivity): 4 ng/L (ref <18)

## 2018-09-16 LAB — HIV ANTIBODY (ROUTINE TESTING W REFLEX): HIV Screen 4th Generation wRfx: NONREACTIVE

## 2018-09-16 LAB — FERRITIN: Ferritin: 412 ng/mL — ABNORMAL HIGH (ref 24–336)

## 2018-09-16 LAB — FIBRINOGEN: Fibrinogen: 501 mg/dL — ABNORMAL HIGH (ref 210–475)

## 2018-09-16 LAB — SEDIMENTATION RATE: Sed Rate: 3 mm/hr (ref 0–16)

## 2018-09-16 MED ORDER — HYDRALAZINE HCL 20 MG/ML IJ SOLN: 5.0000 mg | INTRAMUSCULAR | Status: DC | PRN

## 2018-09-16 MED ORDER — VITAMIN D 25 MCG (1000 UNIT) PO TABS
1000.0000 [IU] | ORAL_TABLET | Freq: Every day | ORAL | Status: DC
  Administered 2018-09-16 – 2018-09-18 (×3): 1000 [IU] via ORAL
  Filled 2018-09-16 (×3): qty 1

## 2018-09-16 MED ORDER — HYDROCODONE-ACETAMINOPHEN 5-325 MG PO TABS: 1.0000 | ORAL_TABLET | Freq: Four times a day (QID) | ORAL | Status: DC | PRN

## 2018-09-16 MED ORDER — VITAMIN B-12 100 MCG PO TABS
100.0000 ug | ORAL_TABLET | Freq: Every day | ORAL | Status: DC
  Administered 2018-09-16 – 2018-09-18 (×3): 100 ug via ORAL
  Filled 2018-09-16 (×3): qty 1

## 2018-09-16 MED ORDER — LEVOTHYROXINE SODIUM 50 MCG PO TABS
150.0000 ug | ORAL_TABLET | Freq: Every day | ORAL | Status: DC
  Administered 2018-09-16 – 2018-09-18 (×3): 150 ug via ORAL
  Filled 2018-09-16 (×3): qty 1

## 2018-09-16 MED ORDER — ONDANSETRON HCL 4 MG/2ML IJ SOLN: 4.0000 mg | Freq: Four times a day (QID) | INTRAMUSCULAR | Status: DC | PRN

## 2018-09-16 MED ORDER — POLYVINYL ALCOHOL 1.4 % OP SOLN
1.0000 [drp] | OPHTHALMIC | Status: DC | PRN
  Administered 2018-09-16: 1 [drp] via OPHTHALMIC
  Filled 2018-09-16: qty 15

## 2018-09-16 MED ORDER — VITAMIN C 500 MG PO TABS
1000.0000 mg | ORAL_TABLET | Freq: Every day | ORAL | Status: DC
  Administered 2018-09-16 – 2018-09-18 (×3): 1000 mg via ORAL
  Filled 2018-09-16 (×3): qty 2

## 2018-09-16 MED ORDER — HYDROCHLOROTHIAZIDE 12.5 MG PO CAPS
12.5000 mg | ORAL_CAPSULE | Freq: Every day | ORAL | Status: DC
  Administered 2018-09-16 – 2018-09-18 (×3): 12.5 mg via ORAL
  Filled 2018-09-16 (×3): qty 1

## 2018-09-16 MED ORDER — ADULT MULTIVITAMIN W/MINERALS CH
1.0000 | ORAL_TABLET | Freq: Every day | ORAL | Status: DC
  Administered 2018-09-16 – 2018-09-18 (×3): 1 via ORAL
  Filled 2018-09-16 (×3): qty 1

## 2018-09-16 MED ORDER — IPRATROPIUM BROMIDE HFA 17 MCG/ACT IN AERS
2.0000 | INHALATION_SPRAY | Freq: Three times a day (TID) | RESPIRATORY_TRACT | Status: DC
  Administered 2018-09-16 – 2018-09-18 (×6): 2 via RESPIRATORY_TRACT
  Filled 2018-09-16: qty 12.9

## 2018-09-16 MED ORDER — ONDANSETRON HCL 4 MG PO TABS: 4.0000 mg | ORAL_TABLET | Freq: Four times a day (QID) | ORAL | Status: DC | PRN

## 2018-09-16 MED ORDER — ENOXAPARIN SODIUM 40 MG/0.4ML ~~LOC~~ SOLN
40.0000 mg | Freq: Every day | SUBCUTANEOUS | Status: DC
  Administered 2018-09-16 – 2018-09-18 (×3): 40 mg via SUBCUTANEOUS
  Filled 2018-09-16 (×3): qty 0.4

## 2018-09-16 MED ORDER — ZINC SULFATE 220 (50 ZN) MG PO CAPS
220.0000 mg | ORAL_CAPSULE | Freq: Every day | ORAL | Status: DC
  Administered 2018-09-16 – 2018-09-18 (×3): 220 mg via ORAL
  Filled 2018-09-16 (×4): qty 1

## 2018-09-16 MED ORDER — LEVALBUTEROL TARTRATE 45 MCG/ACT IN AERO
2.0000 | INHALATION_SPRAY | Freq: Three times a day (TID) | RESPIRATORY_TRACT | Status: DC
  Administered 2018-09-16 – 2018-09-18 (×6): 2 via RESPIRATORY_TRACT

## 2018-09-16 NOTE — Progress Notes (Signed)
PROGRESS NOTE  DAY DEERY XBL:[id number] DOB: [birth year] DOA: 09/15/2018 PCP: Aura Dials, MD  HPI/Recap of past 24 hours: Bob Burton is a 63 y.o. male with medical history significant of hypertension, hyperlipidemia, hypothyroidism, gout, hereditary hemochromatosis, who presents with fever, chills, cough, shortness of breath and chest pain.  Patient states that he has been having fever, chills, dry cough, shortness of breath for almost 10 days.  He has fever 101 at home.  He also has pleuritic chest pain, which is located in central chest, moderate, nonradiating, preventing him taking deep breaths.  Denies runny nose or sore throat.  Patient has some mild intermittent diarrhea recently.  He also has nausea, but no vomiting or abdominal pain.  Denies symptoms of UTI or unilateral weakness.  Of note, patient was recently tested positive for COVID-19.   ED Course: pt was found to have positive COVID-19 test, WBC 3.2, lactic acid 1.4, abnormal liver function (ALP 72, AST 92, ALT 146, total bilirubin 1.1), electrolytes renal function okay, temperature 100.1, blood pressure 123/76, tachycardia, tachypnea, oxygen saturation 92 to 94% on room air.  CXR showed probable atelectasis in the bases, but not convincing evidence of focal infiltrate. Pt is placed on telemetry bed for observation.  09/16/18: Patient was seen and examined at his bedside this morning.  Reports persistent diarrhea and dyspnea with ambulation.  He states cough is mildly improving.  He denies chest pain.  Assessment/Plan: Principal Problem:   Acute respiratory disease due to COVID-19 virus Active Problems:   Hypothyroidism   Elevated liver enzymes   Hereditary hemochromatosis ([place])   HTN (hypertension)   Thrombocytopenia (H[facility])   Sepsis ([place])  Sepsis secondary to COVID-19 viral pneumonia Presented with leukopenia with WBC of 3.2, fever, tachycardia, tachypnea Positive in-house COVID-19 test on  09/15/2018. Also hypoxic on presentation with O2 saturation 89% on room air Chest x-ray independently reviewed revealed bilateral patchy infiltrates worse at bases. Dyspnea is persistent worse with ambulation Continue to monitor fever curve and WBC Repeat CBC with differential tomorrow Continue to monitor inflammatory markers Continue Solu-Medrol IV 60 mg twice daily Add DuoNeb inhaler 3 times daily Continue vitamin D3, zinc and vitamin C  Acute hypoxic respiratory failure secondary to COVID-19 viral pneumonia Not on oxygen supplementation at baseline Presented with O2 saturation 89% on room air Maintain O2 saturation greater than 92% Continue inhalers and IV steroids  Diarrhea likely secondary to COVID-19 infection Treat symptomatically Monitor electrolytes Potassium 3.9  Elevated transaminases possibly related to COVID-19 viral infection AST and ALT elevated Trending down  Hypothyroidism Continue Synthroid  Hypertension Continue to hold lisinopril Restart home HCTZ 12.5 mg daily Continue to monitor vital signs  Hereditary hemochromatosis: pt is getting phlebotomy treatment, last treatment was on 05/01/2018 followed up with Dr. Irene Limbo. His Hgb is 17.6 today. -f/u with hematologist.  Thrombocytopenia [facility]): Platelet 105, likely due to ongoing infection.  Mental status normal. -check peripheral smear and LDH - Follow-up with CBC    DVT ppx: SQ Lovenox daily Code Status: Full code Family Communication: None at bed side.     Disposition Plan:   Transfer to [facility] to continue care. Consults called:  none   Objective: Vitals:   09/15/18 2300 09/15/18 2330 09/16/18 0116 09/16/18 0457  BP: 136/88 (!) 131/91 (!) 120/91 (!) 145/86  Pulse: (!) 122 95 89 80  Resp: (!) 21 (!) 26 18 18   Temp:   98.6 F (37 C) 98 F (36.7 C)  TempSrc:   Oral Oral  SpO2: (!) 86% 90% 91% (!) 89%  Weight:   120.4 kg   Height:   5' 11"  (1.803 m)     Intake/Output Summary (Last 24 hours) at  09/16/2018 0800 Last data filed at 09/16/2018 0500 Gross per 24 hour  Intake 420 ml  Output 150 ml  Net 270 ml   Filed Weights   09/16/18 0116  Weight: 120.4 kg    Exam:  . General: 63 y.o. year-old male well developed well nourished in no acute distress.  Alert and oriented x3.  Persistent cough on exam. . Cardiovascular: Regular rate and rhythm with no rubs or gallops.  No thyromegaly or JVD noted.   [name] Respiratory: Mild rales at bases with no wheezes noted.  Poor inspiratory effort. . Abdomen: Soft nontender nondistended with normal bowel sounds x4 quadrants. . Musculoskeletal: Trace lower extremity edema. 2/4 pulses in all 4 extremities. [name] Psychiatry: Mood is appropriate for condition and setting   Data Reviewed: CBC: Recent Labs  Lab 09/15/18 1817 09/16/18 0455  WBC 3.2* 2.9*  NEUTROABS 1.7 1.8  HGB 17.6* 17.4*  HCT 50.3 50.5  MCV 87.5 89.4  PLT 105* 99*   Basic Metabolic Panel: Recent Labs  Lab 09/15/18 1817 09/16/18 0455  NA 138 139  K 3.5 3.9  CL 108 110  CO2 20* 17*  GLUCOSE 125* 158*  BUN 18 19  CREATININE 1.12 0.96  CALCIUM 8.3* 8.0*   GFR: Estimated Creatinine Clearance: 102.6 mL/min (by C-G formula based on SCr of 0.96 mg/dL). Liver Function Tests: Recent Labs  Lab 09/15/18 1817 09/16/18 0455  AST 92* 80*  ALT 146* 127*  ALKPHOS 72 65  BILITOT 1.1 0.7  PROT 7.4 6.8  ALBUMIN 3.9 3.5   No results for input(s): LIPASE, AMYLASE in the last 168 hours. No results for input(s): AMMONIA in the last 168 hours. Coagulation Profile: No results for input(s): INR, PROTIME in the last 168 hours. Cardiac Enzymes: Recent Labs  Lab 09/16/18 0455  CKTOTAL 300   BNP (last 3 results) No results for input(s): PROBNP in the last 8760 hours. HbA1C: No results for input(s): HGBA1C in the last 72 hours. CBG: No results for input(s): GLUCAP in the last 168 hours. Lipid Profile: Recent Labs    09/16/18 0454  TRIG 113   Thyroid Function Tests: No  results for input(s): TSH, T4TOTAL, FREET4, T3FREE, THYROIDAB in the last 72 hours. Anemia Panel: Recent Labs    09/16/18 0454  FERRITIN 412*   Urine analysis:    Component Value Date/Time   COLORURINE AMBER (A) 09/15/2018 2100   APPEARANCEUR CLEAR 09/15/2018 2100   LABSPEC 1.027 09/15/2018 2100   PHURINE 5.0 09/15/2018 2100   GLUCOSEU NEGATIVE 09/15/2018 2100   HGBUR NEGATIVE 09/15/2018 2100   BILIRUBINUR NEGATIVE 09/15/2018 2100   KETONESUR 5 (A) 09/15/2018 2100   PROTEINUR 30 (A) 09/15/2018 2100   NITRITE NEGATIVE 09/15/2018 2100   LEUKOCYTESUR NEGATIVE 09/15/2018 2100   Sepsis Labs: @LABRCNTIP (procalcitonin:4,lacticidven:4)  ) Recent Results (from the past 240 hour(s))  SARS Coronavirus 2 (CEPHEID - Performed in [place] hospital lab), Hosp Order     Status: Abnormal   Collection Time: 09/15/18  5:56 PM   Specimen: Nasopharyngeal Swab  Result Value Ref Range Status   SARS Coronavirus 2 POSITIVE (A) NEGATIVE Final    Comment: RESULT CALLED TO, READ BACK BY AND VERIFIED WITH: HODGES,S. RN @2047  ON 07.12.2020 BY COHEN,K (NOTE) If result is NEGATIVE SARS-CoV-2 target nucleic acids are NOT DETECTED. The  SARS-CoV-2 RNA is generally detectable in upper and lower  respiratory specimens during the acute phase of infection. The lowest  concentration of SARS-CoV-2 viral copies this assay can detect is 250  copies / mL. A negative result does not preclude SARS-CoV-2 infection  and should not be used as the sole basis for treatment or other  patient management decisions.  A negative result may occur with  improper specimen collection / handling, submission of specimen other  than nasopharyngeal swab, presence of viral mutation(s) within the  areas targeted by this assay, and inadequate number of viral copies  (<250 copies / mL). A negative result must be combined with clinical  observations, patient history, and epidemiological information. If result is POSITIVE SARS-CoV-2  target nucleic acids are DETE CTED. The SARS-CoV-2 RNA is generally detectable in upper and lower  respiratory specimens during the acute phase of infection.  Positive  results are indicative of active infection with SARS-CoV-2.  Clinical  correlation with patient history and other diagnostic information is  necessary to determine patient infection status.  Positive results do  not rule out bacterial infection or co-infection with other viruses. If result is PRESUMPTIVE POSTIVE SARS-CoV-2 nucleic acids MAY BE PRESENT.   A presumptive positive result was obtained on the submitted specimen  and confirmed on repeat testing.  While 2019 novel coronavirus  (SARS-CoV-2) nucleic acids may be present in the submitted sample  additional confirmatory testing may be necessary for epidemiological  and / or clinical management purposes  to differentiate between  SARS-CoV-2 and other Sarbecovirus currently known to infect humans.  If clinically indicated additional testing with an alternate test  methodology (LAB [phone]) is advised. The SARS-CoV-2 RNA is generally  detectable in upper and lower respiratory specimens during the acute  phase of infection. The expected result is Negative. Fact Sheet for Patients:  StrictlyIdeas.no Fact Sheet for Healthcare Providers: BankingDealers.co.za This test is not yet approved or cleared by the [place] FDA and has been authorized for detection and/or diagnosis of SARS-CoV-2 by FDA under an Emergency Use Authorization (EUA).  This EUA will remain in effect (meaning this test can be used) for the duration of the COVID-19 declaration under Section 564(b)(1) of the Act, 21 U.S.C. section 360bbb-3(b)(1), unless the authorization is terminated or revoked sooner. Performed at [facility], [place] [address] [place], [place] [address]       Studies: Dg Chest Port 1 View  Result Date: 09/15/2018  CLINICAL DATA:  Cough. Patient recently tested positive for COVID-19. EXAM: PORTABLE CHEST 1 VIEW COMPARISON:  None. FINDINGS: Probable mild atelectasis in the bases. No convincing evidence of infiltrate. The cardiomediastinal silhouette is unremarkable. IMPRESSION: Probable atelectasis in the bases. No convincing evidence of focal infiltrate. Electronically Signed   By: Dorise Bullion III M.D   On: 09/15/2018 17:44    Scheduled Meds: . cholecalciferol  1,000 Units Oral Daily  . enoxaparin (LOVENOX) injection  40 mg Subcutaneous Daily  . ipratropium  2 puff Inhalation Q4H  . levothyroxine  150 mcg Oral QAC breakfast  . methylPREDNISolone (SOLU-MEDROL) injection  60 mg Intravenous Q12H  . multivitamin with minerals  1 tablet Oral Daily  . vitamin B-12  100 mcg Oral Daily  . zinc sulfate  220 mg Oral Daily    Continuous Infusions:   LOS: 0 days     Kayleen Memos, MD [facility] Pager [phone]  If 7PM-7AM, please contact night-coverage [email or web address] Password [email or web address] 09/16/2018, 8:00 AM

## 2018-09-17 LAB — COMPREHENSIVE METABOLIC PANEL
ALT: 105 U/L — ABNORMAL HIGH (ref 0–44)
AST: 56 U/L — ABNORMAL HIGH (ref 15–41)
Albumin: 3.5 g/dL (ref 3.5–5.0)
Alkaline Phosphatase: 71 U/L (ref 38–126)
Anion gap: 13 (ref 5–15)
BUN: 23 mg/dL (ref 8–23)
CO2: 18 mmol/L — ABNORMAL LOW (ref 22–32)
Calcium: 8.5 mg/dL — ABNORMAL LOW (ref 8.9–10.3)
Chloride: 107 mmol/L (ref 98–111)
Creatinine, Ser: 0.94 mg/dL (ref 0.61–1.24)
GFR calc Af Amer: >60 mL/min (ref >60)
GFR calc non Af Amer: >60 mL/min (ref >60)
Glucose, Bld: 200 mg/dL — ABNORMAL HIGH (ref 70–99)
Potassium: 3.7 mmol/L (ref 3.5–5.1)
Sodium: 138 mmol/L (ref 135–145)
Total Bilirubin: 0.5 mg/dL (ref 0.3–1.2)
Total Protein: 7 g/dL (ref 6.5–8.1)

## 2018-09-17 LAB — CBC WITH DIFFERENTIAL/PLATELET
Abs Immature Granulocytes: 0.04 10*3/uL (ref 0.00–0.07)
Basophils Absolute: 0 10*3/uL (ref 0.0–0.1)
Basophils Relative: 0 %
Eosinophils Absolute: 0 10*3/uL (ref 0.0–0.5)
Eosinophils Relative: 0 %
HCT: 49.9 % (ref 39.0–52.0)
Hemoglobin: 17.4 g/dL — ABNORMAL HIGH (ref 13.0–17.0)
Immature Granulocytes: 0 %
Lymphocytes Relative: 10 %
Lymphs Abs: 0.9 10*3/uL (ref 0.7–4.0)
MCH: 30.5 pg (ref 26.0–34.0)
MCHC: 34.9 g/dL (ref 30.0–36.0)
MCV: 87.5 fL (ref 80.0–100.0)
Monocytes Absolute: 0.5 10*3/uL (ref 0.1–1.0)
Monocytes Relative: 6 %
Neutro Abs: 7.5 10*3/uL (ref 1.7–7.7)
Neutrophils Relative %: 84 %
Platelets: DECREASED 10*3/uL (ref 150–400)
RBC: 5.7 MIL/uL (ref 4.22–5.81)
RDW: 13.4 % (ref 11.5–15.5)
WBC: 8.9 10*3/uL (ref 4.0–10.5)
nRBC: 0 % (ref 0.0–0.2)

## 2018-09-17 LAB — LEGIONELLA PNEUMOPHILA SEROGP 1 UR AG: L. pneumophila Serogp 1 Ur Ag: NEGATIVE

## 2018-09-17 LAB — FERRITIN: Ferritin: 391 ng/mL — ABNORMAL HIGH (ref 24–336)

## 2018-09-17 LAB — HEPATITIS B SURFACE ANTIGEN: Hepatitis B Surface Ag: NEGATIVE

## 2018-09-17 LAB — URINE CULTURE: Culture: NO GROWTH

## 2018-09-17 LAB — C-REACTIVE PROTEIN: CRP: 1.4 mg/dL — ABNORMAL HIGH (ref <1.0)

## 2018-09-17 LAB — TRIGLYCERIDES: Triglycerides: 143 mg/dL (ref <150)

## 2018-09-17 LAB — CK: Total CK: 723 U/L — ABNORMAL HIGH (ref 49–397)

## 2018-09-17 LAB — D-DIMER, QUANTITATIVE: D-Dimer, Quant: 0.34 ug/mL-FEU (ref 0.00–0.50)

## 2018-09-17 LAB — INTERLEUKIN-6, PLASMA: Interleukin-6, Plasma: 7 pg/mL (ref 0.0–12.2)

## 2018-09-17 MED ORDER — HYDROCOD POLST-CPM POLST ER 10-8 MG/5ML PO SUER
5.0000 mL | Freq: Two times a day (BID) | ORAL | Status: DC
  Administered 2018-09-17 – 2018-09-18 (×3): 5 mL via ORAL
  Filled 2018-09-17 (×3): qty 5

## 2018-09-17 NOTE — Progress Notes (Signed)
PROGRESS NOTE  Bob Burton NID:[id number] DOB: [birth year] DOA: 09/15/2018 PCP: Aura Dials, MD  HPI/Recap of past 24 hours: Bob Burton is a 63 y.o. male with medical history significant of hypertension, hyperlipidemia, hypothyroidism, gout, hereditary hemochromatosis, who presents with fever, chills, cough, shortness of breath and chest pain.  Patient states that he has been having fever, chills, dry cough, shortness of breath for almost 10 days.  He has fever 101 at home.  He also has pleuritic chest pain, which is located in central chest, moderate, nonradiating, preventing him taking deep breaths.  Denies runny nose or sore throat.  Patient has some mild intermittent diarrhea recently.  He also has nausea, but no vomiting or abdominal pain.  Denies symptoms of UTI or unilateral weakness.  Of note, patient was recently tested positive for COVID-19.   ED Course: pt was found to have positive COVID-19 test, WBC 3.2, lactic acid 1.4, abnormal liver function (ALP 72, AST 92, ALT 146, total bilirubin 1.1).  Admitted for acute COVID-19 viral pneumonia.  09/17/18: Patient seen and examined at his bedside this morning.  He reports a persistent nonproductive cough with pleuritic chest pain worse with coughing.  States his diarrhea is improved and denies any abdominal pain or nausea.   Assessment/Plan: Principal Problem:   Acute respiratory disease due to COVID-19 virus Active Problems:   Hypothyroidism   Elevated liver enzymes   Hereditary hemochromatosis (HCC)   HTN (hypertension)   Thrombocytopenia (HCC)   Sepsis (HCC)   SOB (shortness of breath)  Resolving sepsis secondary to acute COVID-19 viral pneumonia Presented with leukopenia with WBC of 3.2 >> 2.9, fever, tachycardia, tachypnea Positive in-house COVID-19 test on 09/15/2018, hypoxic with O2 saturation 89% on room air.  Chest x-ray indicative of viral pneumonia.  Elevated transaminases, hold off Remdesivir. If  no improvement of hypoxia start Remdesivir. Currently O2 saturation 91% on room air from 89% on room air yesterday Continue IV Solu-Medrol 60 mg twice daily, albuterol inhaler 2 puffs 3 times daily, ipratropium inhaler 2 puffs 3 times daily, vitamin C, D3, zinc supplement Elevated inflammatory markers are trending down CRP 1.4 from 2.6 yesterday Ferritin 391 from 412 yesterday D-dimer 0.34 today from 0.53 yesterday Continue to trend inflammatory markers Continue to maintain O2 saturation greater than 92%  Acute hypoxic respiratory failure secondary to COVID-19 viral pneumonia Not on oxygen supplementation at baseline Presented with O2 saturation 89% on room air Currently O2 saturation 91% on room air Continue inhalers and IV steroids  Resolving diarrhea likely secondary to COVID-19 infection Treat symptomatically Monitor electrolytes Potassium 3.9 >>3.7  Elevated transaminases possibly related to COVID-19 viral infection AST and ALT elevated and trending down Daily CMP  Hypothyroidism Continue Synthroid  Hypertension Blood pressure is at goal Continue to hold lisinopril Continue HCTZ 12.5 mg daily Continue to monitor vital signs  Hereditary hemochromatosis: pt is getting phlebotomy treatment, last treatment was on 05/01/2018 followed up with Dr. Irene Limbo. His Hgb is 17.6 today. -f/u with hematologist.  Thrombocytopenia [facility]): Platelet 105, likely due to ongoing infection.  Mental status normal. -check peripheral smear and LDH - Follow-up with CBC    DVT ppx: SQ Lovenox daily Code Status: Full code Family Communication: None at bed side.     Disposition Plan:   Transfer to [facility] to continue care when bed is available. Consults called:  none   Objective: Vitals:   09/16/18 0457 09/16/18 1458 09/16/18 2116 09/17/18 0328  BP: (!) 145/86 (!) 143/95 (!) 140/92 124/90  Pulse: 80 72 75 72  Resp: 18 20 18 18   Temp: 98 F (36.7 C) 97.7 F (36.5 C) 98 F (36.7 C) 97.8 F  (36.6 C)  TempSrc: Oral Oral Oral Oral  SpO2: (!) 89% 91% 94% 91%  Weight:      Height:        Intake/Output Summary (Last 24 hours) at 09/17/2018 0806 Last data filed at 09/16/2018 1200 Gross per 24 hour  Intake 600 ml  Output -  Net 600 ml   Filed Weights   09/16/18 0116  Weight: 120.4 kg    Exam:  . General: 63 y.o. year-old male well-developed well-nourished in no acute distress.  Alert and oriented x3.  Persistent cough noted at bedside.   . Cardiovascular: Regular rate and rhythm.  No rubs or gallops.  No JVD or thyromegaly.   [name] Respiratory: Diffuse rales bilaterally.  No wheezes noted.  Poor inspiratory effort.. . Abdomen: Obese soft nontender.  Normal bowel sounds.. . Musculoskeletal: Trace lower extremity edema.  2 out of 4 pulses in all 4 extremities. [name] Psychiatry: Mood is appropriate for condition and setting.  Data Reviewed: CBC: Recent Labs  Lab 09/15/18 1817 09/16/18 0455 09/17/18 0442  WBC 3.2* 2.9* 8.9  NEUTROABS 1.7 1.8 7.5  HGB 17.6* 17.4* 17.4*  HCT 50.3 50.5 49.9  MCV 87.5 89.4 87.5  PLT 105* 99* PLATELET CLUMPS NOTED ON SMEAR, COUNT APPEARS DECREASED   Basic Metabolic Panel: Recent Labs  Lab 09/15/18 1817 09/16/18 0455 09/17/18 0442  NA 138 139 138  K 3.5 3.9 3.7  CL 108 110 107  CO2 20* 17* 18*  GLUCOSE 125* 158* 200*  BUN 18 19 23   CREATININE 1.12 0.96 0.94  CALCIUM 8.3* 8.0* 8.5*   GFR: Estimated Creatinine Clearance: 104.8 mL/min (by C-G formula based on SCr of 0.94 mg/dL). Liver Function Tests: Recent Labs  Lab 09/15/18 1817 09/16/18 0455 09/17/18 0442  AST 92* 80* 56*  ALT 146* 127* 105*  ALKPHOS 72 65 71  BILITOT 1.1 0.7 0.5  PROT 7.4 6.8 7.0  ALBUMIN 3.9 3.5 3.5   No results for input(s): LIPASE, AMYLASE in the last 168 hours. No results for input(s): AMMONIA in the last 168 hours. Coagulation Profile: No results for input(s): INR, PROTIME in the last 168 hours. Cardiac Enzymes: Recent Labs  Lab 09/16/18 0455  09/17/18 0442  CKTOTAL 300 723*   BNP (last 3 results) No results for input(s): PROBNP in the last 8760 hours. HbA1C: No results for input(s): HGBA1C in the last 72 hours. CBG: No results for input(s): GLUCAP in the last 168 hours. Lipid Profile: Recent Labs    09/16/18 0454 09/17/18 0442  TRIG 113 143   Thyroid Function Tests: No results for input(s): TSH, T4TOTAL, FREET4, T3FREE, THYROIDAB in the last 72 hours. Anemia Panel: Recent Labs    09/16/18 0454 09/17/18 0442  FERRITIN 412* 391*   Urine analysis:    Component Value Date/Time   COLORURINE AMBER (A) 09/15/2018 2100   APPEARANCEUR CLEAR 09/15/2018 2100   LABSPEC 1.027 09/15/2018 2100   PHURINE 5.0 09/15/2018 2100   GLUCOSEU NEGATIVE 09/15/2018 2100   HGBUR NEGATIVE 09/15/2018 2100   BILIRUBINUR NEGATIVE 09/15/2018 2100   KETONESUR 5 (A) 09/15/2018 2100   PROTEINUR 30 (A) 09/15/2018 2100   NITRITE NEGATIVE 09/15/2018 2100   LEUKOCYTESUR NEGATIVE 09/15/2018 2100   Sepsis Labs: @LABRCNTIP (procalcitonin:4,lacticidven:4)  ) Recent Results (from the past 240 hour(s))  SARS Coronavirus 2 (CEPHEID - Performed in [facility] hospital lab), Hosp Order     Status: Abnormal   Collection Time: 09/15/18  5:56 PM   Specimen: Nasopharyngeal Swab  Result Value Ref Range Status   SARS Coronavirus 2 POSITIVE (A) NEGATIVE Final    Comment: RESULT CALLED TO, READ BACK BY AND VERIFIED WITH: HODGES,S. RN @2047  ON 07.12.2020 BY COHEN,K (NOTE) If result is NEGATIVE SARS-CoV-2 target nucleic acids are NOT DETECTED. The SARS-CoV-2 RNA is generally detectable in upper and lower  respiratory specimens during the acute phase of infection. The lowest  concentration of SARS-CoV-2 viral copies this assay can detect is 250  copies / mL. A negative result does not preclude SARS-CoV-2 infection  and should not be used as the sole basis for treatment or other  patient management decisions.  A negative result may occur with  improper  specimen collection / handling, submission of specimen other  than nasopharyngeal swab, presence of viral mutation(s) within the  areas targeted by this assay, and inadequate number of viral copies  (<250 copies / mL). A negative result must be combined with clinical  observations, patient history, and epidemiological information. If result is POSITIVE SARS-CoV-2 target nucleic acids are DETE CTED. The SARS-CoV-2 RNA is generally detectable in upper and lower  respiratory specimens during the acute phase of infection.  Positive  results are indicative of active infection with SARS-CoV-2.  Clinical  correlation with patient history and other diagnostic information is  necessary to determine patient infection status.  Positive results do  not rule out bacterial infection or co-infection with other viruses. If result is PRESUMPTIVE POSTIVE SARS-CoV-2 nucleic acids MAY BE PRESENT.   A presumptive positive result was obtained on the submitted specimen  and confirmed on repeat testing.  While 2019 novel coronavirus  (SARS-CoV-2) nucleic acids may be present in the submitted sample  additional confirmatory testing may be necessary for epidemiological  and / or clinical management purposes  to differentiate between  SARS-CoV-2 and other Sarbecovirus currently known to infect humans.  If clinically indicated additional testing with an alternate test  methodology (LAB [phone]) is advised. The SARS-CoV-2 RNA is generally  detectable in upper and lower respiratory specimens during the acute  phase of infection. The expected result is Negative. Fact Sheet for Patients:  StrictlyIdeas.no Fact Sheet for Healthcare Providers: BankingDealers.co.za This test is not yet approved or cleared by the [place] FDA and has been authorized for detection and/or diagnosis of SARS-CoV-2 by FDA under an Emergency Use Authorization (EUA).  This EUA will remain in  effect (meaning this test can be used) for the duration of the COVID-19 declaration under Section 564(b)(1) of the Act, 21 U.S.C. section 360bbb-3(b)(1), unless the authorization is terminated or revoked sooner. Performed at [facility], [place] [address], [place], [place] [address]   Blood culture (routine x 2)     Status: None (Preliminary result)   Collection Time: 09/15/18  6:04 PM   Specimen: BLOOD LEFT FOREARM  Result Value Ref Range Status   Specimen Description   Final    BLOOD LEFT FOREARM Performed at [place] [address], [place], [place] [address]    Special Requests   Final    BOTTLES DRAWN AEROBIC AND ANAEROBIC Blood Culture results may not be optimal due to an excessive volume of blood received in culture bottles Performed at [place] [address] [place], [place] [address]    Culture   Final    NO GROWTH <  24 HOURS Performed at [facility], [place] [address] [place], [place] [address]    Report Status PENDING  Incomplete  Blood culture (routine x 2)     Status: None (Preliminary result)   Collection Time: 09/15/18  6:10 PM   Specimen: BLOOD LEFT FOREARM  Result Value Ref Range Status   Specimen Description   Final    BLOOD LEFT FOREARM Performed at [place] [address] [place], [place] [address]    Special Requests   Final    BOTTLES DRAWN AEROBIC AND ANAEROBIC Blood Culture results may not be optimal due to an excessive volume of blood received in culture bottles Performed at [place] [address], [place], [place] [address]    Culture   Final    NO GROWTH < 24 HOURS Performed at [place] [address] [place], [place] [address]    Report Status PENDING  Incomplete  Urine culture     Status: None   Collection Time: 09/15/18  9:00 PM   Specimen: Urine, Random  Result Value Ref Range Status   Specimen Description    Final    URINE, RANDOM Performed at [place] [address] [place], [place] [address]    Special Requests   Final    NONE Performed at [facility], [place] [address], [place], [place] [address]    Culture   Final    NO GROWTH Performed at [facility], [place] [address], [place], [place] [address]    Report Status 09/17/2018 FINAL  Final  Respiratory Panel by PCR     Status: None   Collection Time: 09/16/18  7:00 AM   Specimen: Nasopharyngeal Swab; Respiratory  Result Value Ref Range Status   Adenovirus NOT DETECTED NOT DETECTED Final   Coronavirus 229E NOT DETECTED NOT DETECTED Final    Comment: (NOTE) The Coronavirus on the Respiratory Panel, DOES NOT test for the novel  Coronavirus (2019 nCoV)    Coronavirus HKU1 NOT DETECTED NOT DETECTED Final   Coronavirus NL63 NOT DETECTED NOT DETECTED Final   Coronavirus OC43 NOT DETECTED NOT DETECTED Final   Metapneumovirus NOT DETECTED NOT DETECTED Final   Rhinovirus / Enterovirus NOT DETECTED NOT DETECTED Final   Influenza A NOT DETECTED NOT DETECTED Final   Influenza B NOT DETECTED NOT DETECTED Final   Parainfluenza Virus 1 NOT DETECTED NOT DETECTED Final   Parainfluenza Virus 2 NOT DETECTED NOT DETECTED Final   Parainfluenza Virus 3 NOT DETECTED NOT DETECTED Final   Parainfluenza Virus 4 NOT DETECTED NOT DETECTED Final   Respiratory Syncytial Virus NOT DETECTED NOT DETECTED Final   Bordetella pertussis NOT DETECTED NOT DETECTED Final   Chlamydophila pneumoniae NOT DETECTED NOT DETECTED Final   Mycoplasma pneumoniae NOT DETECTED NOT DETECTED Final    Comment: Performed at [facility], [place] [address], [place], [place] [address]      Studies: No results found.  Scheduled Meds: . cholecalciferol  1,000 Units Oral Daily  . enoxaparin (LOVENOX) injection  40 mg Subcutaneous Daily  . hydrochlorothiazide  12.5 mg Oral Daily  . ipratropium  2 puff Inhalation Q8H  . levalbuterol   2 puff Inhalation Q8H  . levothyroxine  150 mcg Oral QAC breakfast  . methylPREDNISolone (SOLU-MEDROL) injection  60 mg Intravenous Q12H  . multivitamin with minerals  1 tablet Oral Daily  . vitamin B-12  100 mcg Oral Daily  .  vitamin C  1,000 mg Oral Daily  . zinc sulfate  220 mg Oral Daily    Continuous Infusions:   LOS: 1 day     Kayleen Memos, MD [facility] Pager [phone]  If 7PM-7AM, please contact night-coverage [email or web address] Password [email or web address] 09/17/2018, 8:06 AM

## 2018-09-18 DIAGNOSIS — J988 Other specified respiratory disorders: Secondary | ICD-10-CM

## 2018-09-18 DIAGNOSIS — J181 Lobar pneumonia, unspecified organism: Secondary | ICD-10-CM

## 2018-09-18 LAB — COMPREHENSIVE METABOLIC PANEL
ALT: 97 U/L — ABNORMAL HIGH (ref 0–44)
AST: 59 U/L — ABNORMAL HIGH (ref 15–41)
Albumin: 3.3 g/dL — ABNORMAL LOW (ref 3.5–5.0)
Alkaline Phosphatase: 72 U/L (ref 38–126)
Anion gap: 12 (ref 5–15)
BUN: 26 mg/dL — ABNORMAL HIGH (ref 8–23)
CO2: 19 mmol/L — ABNORMAL LOW (ref 22–32)
Calcium: 8.6 mg/dL — ABNORMAL LOW (ref 8.9–10.3)
Chloride: 108 mmol/L (ref 98–111)
Creatinine, Ser: 0.94 mg/dL (ref 0.61–1.24)
GFR calc Af Amer: >60 mL/min (ref >60)
GFR calc non Af Amer: >60 mL/min (ref >60)
Glucose, Bld: 177 mg/dL — ABNORMAL HIGH (ref 70–99)
Potassium: 4.2 mmol/L (ref 3.5–5.1)
Sodium: 139 mmol/L (ref 135–145)
Total Bilirubin: 0.7 mg/dL (ref 0.3–1.2)
Total Protein: 6.6 g/dL (ref 6.5–8.1)

## 2018-09-18 LAB — CBC WITH DIFFERENTIAL/PLATELET
Abs Immature Granulocytes: 0.1 10*3/uL — ABNORMAL HIGH (ref 0.00–0.07)
Basophils Absolute: 0 10*3/uL (ref 0.0–0.1)
Basophils Relative: 0 %
Eosinophils Absolute: 0 10*3/uL (ref 0.0–0.5)
Eosinophils Relative: 0 %
HCT: 49.6 % (ref 39.0–52.0)
Hemoglobin: 17.1 g/dL — ABNORMAL HIGH (ref 13.0–17.0)
Immature Granulocytes: 1 %
Lymphocytes Relative: 7 %
Lymphs Abs: 1.1 10*3/uL (ref 0.7–4.0)
MCH: 30.8 pg (ref 26.0–34.0)
MCHC: 34.5 g/dL (ref 30.0–36.0)
MCV: 89.4 fL (ref 80.0–100.0)
Monocytes Absolute: 0.7 10*3/uL (ref 0.1–1.0)
Monocytes Relative: 4 %
Neutro Abs: 13.9 10*3/uL — ABNORMAL HIGH (ref 1.7–7.7)
Neutrophils Relative %: 88 %
Platelets: 156 10*3/uL (ref 150–400)
RBC: 5.55 MIL/uL (ref 4.22–5.81)
RDW: 13.9 % (ref 11.5–15.5)
WBC: 15.7 10*3/uL — ABNORMAL HIGH (ref 4.0–10.5)
nRBC: 0 % (ref 0.0–0.2)

## 2018-09-18 LAB — C-REACTIVE PROTEIN: CRP: 1.1 mg/dL — ABNORMAL HIGH (ref <1.0)

## 2018-09-18 LAB — CK: Total CK: 655 U/L — ABNORMAL HIGH (ref 49–397)

## 2018-09-18 LAB — TRIGLYCERIDES: Triglycerides: 157 mg/dL — ABNORMAL HIGH (ref <150)

## 2018-09-18 LAB — INTERLEUKIN-6, PLASMA: Interleukin-6, Plasma: 12.8 pg/mL — ABNORMAL HIGH (ref 0.0–12.2)

## 2018-09-18 LAB — D-DIMER, QUANTITATIVE: D-Dimer, Quant: 0.27 ug/mL-FEU (ref 0.00–0.50)

## 2018-09-18 LAB — FERRITIN: Ferritin: 324 ng/mL (ref 24–336)

## 2018-09-18 MED ORDER — HYDROCOD POLST-CPM POLST ER 10-8 MG/5ML PO SUER: 5.0000 mL | Freq: Two times a day (BID) | ORAL | 0 refills | Status: AC | PRN

## 2018-09-18 MED ORDER — ASCORBIC ACID 1000 MG PO TABS: 1000.0000 mg | ORAL_TABLET | Freq: Every day | ORAL | 3 refills | Status: AC

## 2018-09-18 MED ORDER — ASPIRIN EC 81 MG PO TBEC: 81.0000 mg | DELAYED_RELEASE_TABLET | Freq: Every day | ORAL | 0 refills | Status: AC

## 2018-09-18 MED ORDER — ADULT MULTIVITAMIN W/MINERALS CH: 1.0000 | ORAL_TABLET | Freq: Every day | ORAL | 3 refills | Status: AC

## 2018-09-18 MED ORDER — VITAMIN D3 25 MCG PO TABS: 1000.0000 [IU] | ORAL_TABLET | Freq: Every day | ORAL | 3 refills | Status: AC

## 2018-09-18 MED ORDER — ALBUTEROL SULFATE HFA 108 (90 BASE) MCG/ACT IN AERS: 2.0000 | INHALATION_SPRAY | Freq: Four times a day (QID) | RESPIRATORY_TRACT | 3 refills | Status: AC | PRN

## 2018-09-18 MED ORDER — PREDNISONE 10 MG PO TABS: ORAL_TABLET | ORAL | 0 refills | Status: AC

## 2018-09-18 MED ORDER — ZINC SULFATE 220 (50 ZN) MG PO CAPS: 220.0000 mg | ORAL_CAPSULE | Freq: Every day | ORAL | 3 refills | Status: AC

## 2018-09-18 MED ORDER — ACETAMINOPHEN 325 MG PO TABS: 650.0000 mg | ORAL_TABLET | Freq: Four times a day (QID) | ORAL | 0 refills | Status: AC | PRN

## 2018-09-18 NOTE — Plan of Care (Signed)

## 2018-09-18 NOTE — Discharge Summary (Signed)
Physician Discharge Summary   Patient ID: Bob Burton MRN: [id number] DOB/AGE: Jun 26, 1955 63 y.o.  Admit date: 09/15/2018 Discharge date: 09/18/2018  Primary Care Physician:  Aura Dials, MD   Recommendations for Outpatient Follow-up:  1. Follow up with PCP in 1-2 weeks with tele-visit 2. COVID-19 positive, patient given discharge instructions and continue to self isolate for safety of others  Home Health: None  Equipment/Devices:   Discharge Condition: stable CODE STATUS: FULL  Diet recommendation: Heart healthy diet   Discharge Diagnoses:    . Acute respiratory failure with hypoxia due to COVID-19 respiratory disease/viral pneumonia Sepsis secondary to acute COVID-19 viral pneumonia Diarrhea . Elevated liver enzymes . Hypothyroidism . HTN (hypertension) . Thrombocytopenia ([place]) . Hereditary hemochromatosis ([place])   Consults: None    Allergies:  No Known Allergies   DISCHARGE MEDICATIONS: Allergies as of 09/18/2018   No Known Allergies     Medication List    TAKE these medications   acetaminophen 325 MG tablet Commonly known as: TYLENOL Take 2 tablets (650 mg total) by mouth every 6 (six) hours as needed for fever (fever >/= 101). OVER THE COUNTER   albuterol 108 (90 Base) MCG/ACT inhaler Commonly known as: VENTOLIN HFA Inhale 2 puffs into the lungs every 6 (six) hours as needed for wheezing or shortness of breath.   ascorbic acid 1000 MG tablet Commonly known as: VITAMIN C Take 1 tablet (1,000 mg total) by mouth daily. Start taking on: September 19, 2018   aspirin EC 81 MG tablet Take 1 tablet (81 mg total) by mouth daily.   chlorpheniramine-HYDROcodone 10-8 MG/5ML Suer Commonly known as: TUSSIONEX Take 5 mLs by mouth every 12 (twelve) hours as needed for cough.   CYANOCOBALAMIN PO Take 1 tablet by mouth daily.   levothyroxine 150 MCG tablet Commonly known as: SYNTHROID Take 150 mcg by mouth daily before breakfast.    lisinopril-hydrochlorothiazide 10-12.5 MG tablet Commonly known as: ZESTORETIC Take 1 tablet by mouth every morning.   multivitamin with minerals Tabs tablet Take 1 tablet by mouth daily. Start taking on: September 19, 2018   predniSONE 10 MG tablet Commonly known as: DELTASONE Prednisone dosing: Take  Prednisone 57m (4 tabs) x 3 days, then taper to 357m(3 tabs) x 3 days, then 2048m2 tabs) x 3days, then 77m1m tab) x 3days, then OFF.   Vitamin D3 25 MCG tablet Commonly known as: Vitamin D Take 1 tablet (1,000 Units total) by mouth daily. Start taking on: September 19, 2018 What changed:   medication strength  how much to take   zinc sulfate 220 (50 Zn) MG capsule Take 1 capsule (220 mg total) by mouth daily. Start taking on: September 19, 2018        Brief H and P: For complete details please refer to admission H and P, but in brief Bob [id number] y.o.malewith medical history significant ofhypertension, hyperlipidemia, hypothyroidism, gout, hereditary hemochromatosis, who presents with fever, chills, cough, shortness of breath and chest pain. Patient states that he has been having fever, chills, dry cough, shortness of breath for almost 10 days. He has fever 101 at home.He also has pleuritic chest pain, which is located in central chest, moderate, nonradiating, preventing him taking deep breaths. Denies runny nose or sore throat. Patient has some mild intermittent diarrhea recently. He also has nausea, but no vomiting or abdominal pain. Denies symptoms of UTI or unilateral weakness. COVID 19 test positive  Hospital Course:  Acute respiratory failure with hypoxia  secondary to [place] pneumonia -Presented with fevers, shortness of breath, pleuritic chest pain, O2 sats 89% on room air -COVID-19 test positive, chest x-ray indicated low viral pneumonia  -Patient was placed on IV steroids, albuterol inhaler, ipratropium inhaler, supportive treatment with  vitamin C D3 zinc supplement. -Patient's hypoxia significantly improved, currently on room air, did not meet criteria for remdesvir -O2 sats 93% on room air, DC home with oral prednisone taper and continue antitussives, vitamin C, D3, zinc supplements, inhalers, aspirin 81 mg daily for 30 days.   Sepsis secondary to acute COVID-19 viral pneumonia -Presented with leukopenia, fever, tachycardia, tachypnea, elevated transaminases, hypoxia, met sepsis criteria at the time of admission -Currently sepsis physiology resolved, inflammatory markers improving   Diarrhea -Secondary to COVID-19 infection, improved   Elevated transaminases -Secondary to COVID-19 infection, improving  Hypertension -BP currently controlled  Hypothyroidism -Continue Synthroid  Hereditary hemachromatosis -Patient with getting phlebotomy treatments, last treatment was on 05/01/2018 -Outpatient follow-up with his hematologist, Dr. Irene Limbo  Thrombocytopenia -Blood counts improving, no acute issues  Day of Discharge S: No chest pain or shortness of breath, diarrhea improving.  Wants to go home.  BP (!) 128/94 (BP Location: Left Arm)   Pulse 70   Temp 97.7 F (36.5 C) (Oral)   Resp 18   Ht _0  (1.803 m)   Wt 120.4 kg   SpO2 93%   BMI 37.02 kg/m   Physical Exam: General: Alert and awake oriented x3 not in any acute distress. HEENT: anicteric sclera, pupils reactive to light and accommodation CVS: S1-S2 clear no murmur rubs or gallops Chest: clear to auscultation bilaterally, no wheezing rales or rhonchi Abdomen: soft nontender, nondistended, normal bowel sounds Extremities: no cyanosis, clubbing or edema noted bilaterally Neuro: Cranial nerves II-XII intact, no focal neurological deficits   The results of significant diagnostics from this hospitalization (including imaging, microbiology, ancillary and laboratory) are listed below for reference.      Procedures/Studies:  Dg Chest Port 1  View  Result Date: 09/15/2018 CLINICAL DATA:  Cough. Patient recently tested positive for COVID-19. EXAM: PORTABLE CHEST 1 VIEW COMPARISON:  None. FINDINGS: Probable mild atelectasis in the bases. No convincing evidence of infiltrate. The cardiomediastinal silhouette is unremarkable. IMPRESSION: Probable atelectasis in the bases. No convincing evidence of focal infiltrate. Electronically Signed   By: Dorise Bullion III M.D   On: 09/15/2018 17:44       LAB RESULTS: Basic Metabolic Panel: Recent Labs  Lab 09/17/18 0442 09/18/18 0450  NA 138 139  K 3.7 4.2  CL 107 108  CO2 18* 19*  GLUCOSE 200* 177*  BUN 23 26*  CREATININE 0.94 0.94  CALCIUM 8.5* 8.6*   Liver Function Tests: Recent Labs  Lab 09/17/18 0442 09/18/18 0450  AST 56* 59*  ALT 105* 97*  ALKPHOS 71 72  BILITOT 0.5 0.7  PROT 7.0 6.6  ALBUMIN 3.5 3.3*   No results for input(s): LIPASE, AMYLASE in the last 168 hours. No results for input(s): AMMONIA in the last 168 hours. CBC: Recent Labs  Lab 09/17/18 0442 09/18/18 0450  WBC 8.9 15.7*  NEUTROABS 7.5 13.9*  HGB 17.4* 17.1*  HCT 49.9 49.6  MCV 87.5 89.4  PLT PLATELET CLUMPS NOTED ON SMEAR, COUNT APPEARS DECREASED 156   Cardiac Enzymes: Recent Labs  Lab 09/17/18 0442 09/18/18 0450  CKTOTAL 723* 655*   BNP: Invalid input(s): POCBNP CBG: No results for input(s): GLUCAP in the last 168 hours.    Disposition and Follow-up: Discharge Instructions  Diet general   Complete by: As directed    Increase activity slowly   Complete by: As directed        DISPOSITION: Home   DISCHARGE FOLLOW-UP Follow-up Information    Aura Dials, MD. Schedule an appointment as soon as possible for a visit in 2 week(s).   Specialty: Family Medicine Contact information: [address] [place] [address] [phone]            Time coordinating discharge:  81mns   Signed:   REstill CottaM.D. [facility] 09/18/2018, 11:53  AM

## 2018-09-18 NOTE — Discharge Instructions (Signed)
COVID-19 COVID-19 is a respiratory infection that is caused by a virus called severe acute respiratory syndrome coronavirus 2 (SARS-CoV-2). The disease is also known as coronavirus disease or novel coronavirus. In some people, the virus may not cause any symptoms. In others, it may cause a serious infection. The infection can get worse quickly and can lead to complications, such as:  Pneumonia, or infection of the lungs.  Acute respiratory distress syndrome or ARDS. This is fluid build-up in the lungs.  Acute respiratory failure. This is a condition in which there is not enough oxygen passing from the lungs to the body.  Sepsis or septic shock. This is a serious bodily reaction to an infection.  Blood clotting problems.  Secondary infections due to bacteria or fungus. The virus that causes COVID-19 is contagious. This means that it can spread from person to person through droplets from coughs and sneezes (respiratory secretions). What are the causes? This illness is caused by a virus. You may catch the virus by:  Breathing in droplets from an infected person's cough or sneeze.  Touching something, like a table or a doorknob, that was exposed to the virus (contaminated) and then touching your mouth, nose, or eyes. What increases the risk? Risk for infection You are more likely to be infected with this virus if you:  Live in or travel to an area with a COVID-19 outbreak.  Come in contact with a sick person who recently traveled to an area with a COVID-19 outbreak.  Provide care for or live with a person who is infected with COVID-19. Risk for serious illness You are more likely to become seriously ill from the virus if you:  Are 49 years of age or older.  Have a long-term disease that lowers your body's ability to fight infection (immunocompromised).  Live in a nursing home or long-term care facility.  Have a long-term (chronic) disease such as: ? Chronic lung disease, including  chronic obstructive pulmonary disease or asthma ? Heart disease. ? Diabetes. ? Chronic kidney disease. ? Liver disease.  Are obese. What are the signs or symptoms? Symptoms of this condition can range from mild to severe. Symptoms may appear any time from 2 to 14 days after being exposed to the virus. They include:  A fever.  A cough.  Difficulty breathing.  Chills.  Muscle pains.  A sore throat.  Loss of taste or smell. Some people may also have stomach problems, such as nausea, vomiting, or diarrhea. Other people may not have any symptoms of COVID-19. How is this diagnosed? This condition may be diagnosed based on:  Your signs and symptoms, especially if: ? You live in an area with a COVID-19 outbreak. ? You recently traveled to or from an area where the virus is common. ? You provide care for or live with a person who was diagnosed with COVID-19.  A physical exam.  Lab tests, which may include: ? A nasal swab to take a sample of fluid from your nose. ? A throat swab to take a sample of fluid from your throat. ? A sample of mucus from your lungs (sputum). ? Blood tests.  Imaging tests, which may include, X-rays, CT scan, or ultrasound. How is this treated? At present, there is no medicine to treat COVID-19. Medicines that treat other diseases are being used on a trial basis to see if they are effective against COVID-19. Your health care provider will talk with you about ways to treat your symptoms. For most  people, the infection is mild and can be managed at home with rest, fluids, and over-the-counter medicines. Treatment for a serious infection usually takes places in a hospital intensive care unit (ICU). It may include one or more of the following treatments. These treatments are given until your symptoms improve.  Receiving fluids and medicines through an IV.  Supplemental oxygen. Extra oxygen is given through a tube in the nose, a face mask, or a  hood.  Positioning you to lie on your stomach (prone position). This makes it easier for oxygen to get into the lungs.  Continuous positive airway pressure (CPAP) or bi-level positive airway pressure (BPAP) machine. This treatment uses mild air pressure to keep the airways open. A tube that is connected to a motor delivers oxygen to the body.  Ventilator. This treatment moves air into and out of the lungs by using a tube that is placed in your windpipe.  Tracheostomy. This is a procedure to create a hole in the neck so that a breathing tube can be inserted.  Extracorporeal membrane oxygenation (ECMO). This procedure gives the lungs a chance to recover by taking over the functions of the heart and lungs. It supplies oxygen to the body and removes carbon dioxide. Follow these instructions at home: Lifestyle  If you are sick, stay home except to get medical care. Your health care provider will tell you how long to stay home. Call your health care provider before you go for medical care.  Rest at home as told by your health care provider.  Do not use any products that contain nicotine or tobacco, such as cigarettes, e-cigarettes, and chewing tobacco. If you need help quitting, ask your health care provider.  Return to your normal activities as told by your health care provider. Ask your health care provider what activities are safe for you. General instructions  Take over-the-counter and prescription medicines only as told by your health care provider.  Drink enough fluid to keep your urine pale yellow.  Keep all follow-up visits as told by your health care provider. This is important. How is this prevented?  There is no vaccine to help prevent COVID-19 infection. However, there are steps you can take to protect yourself and others from this virus. To protect yourself:   Do not travel to areas where COVID-19 is a risk. The areas where COVID-19 is reported change often. To identify  high-risk areas and travel restrictions, check the CDC travel website: FatFares.com.br  If you live in, or must travel to, an area where COVID-19 is a risk, take precautions to avoid infection. ? Stay away from people who are sick. ? Wash your hands often with soap and water for 20 seconds. If soap and water are not available, use an alcohol-based hand sanitizer. ? Avoid touching your mouth, face, eyes, or nose. ? Avoid going out in public, follow guidance from your state and local health authorities. ? If you must go out in public, wear a cloth face covering or face mask. ? Disinfect objects and surfaces that are frequently touched every day. This may include:  Counters and tables.  Doorknobs and light switches.  Sinks and faucets.  Electronics, such as phones, remote controls, keyboards, computers, and tablets. To protect others: If you have symptoms of COVID-19, take steps to prevent the virus from spreading to others.  If you think you have a COVID-19 infection, contact your health care provider right away. Tell your health care team that you think you may  have a COVID-19 infection.  Stay home. Leave your house only to seek medical care. Do not use public transport.  Do not travel while you are sick.  Wash your hands often with soap and water for 20 seconds. If soap and water are not available, use alcohol-based hand sanitizer.  Stay away from other members of your household. Let healthy household members care for children and pets, if possible. If you have to care for children or pets, wash your hands often and wear a mask. If possible, stay in your own room, separate from others. Use a different bathroom.  Make sure that all people in your household wash their hands well and often.  Cough or sneeze into a tissue or your sleeve or elbow. Do not cough or sneeze into your hand or into the air.  Wear a cloth face covering or face mask. Where to find more  information  Centers for Disease Control and Prevention: PurpleGadgets.be  World Health Organization: https://www.castaneda.info/ Contact a health care provider if:  You live in or have traveled to an area where COVID-19 is a risk and you have symptoms of the infection.  You have had contact with someone who has COVID-19 and you have symptoms of the infection. Get help right away if:  You have trouble breathing.  You have pain or pressure in your chest.  You have confusion.  You have bluish lips and fingernails.  You have difficulty waking from sleep.  You have symptoms that get worse. These symptoms may represent a serious problem that is an emergency. Do not wait to see if the symptoms will go away. Get medical help right away. Call your local emergency services (911 in the [place] Do not drive yourself to the hospital. Let the emergency medical personnel know if you think you have COVID-19. Summary  COVID-19 is a respiratory infection that is caused by a virus. It is also known as coronavirus disease or novel coronavirus. It can cause serious infections, such as pneumonia, acute respiratory distress syndrome, acute respiratory failure, or sepsis.  The virus that causes COVID-19 is contagious. This means that it can spread from person to person through droplets from coughs and sneezes.  You are more likely to develop a serious illness if you are 69 years of age or older, have a weak immunity, live in a nursing home, or have chronic disease.  There is no medicine to treat COVID-19. Your health care provider will talk with you about ways to treat your symptoms.  Take steps to protect yourself and others from infection. Wash your hands often and disinfect objects and surfaces that are frequently touched every day. Stay away from people who are sick and wear a mask if you are sick. This information is not intended to replace advice given to you by  your health care provider. Make sure you discuss any questions you have with your health care provider. Document Released: 03/28/2018 Document Revised: 07/18/2018 Document Reviewed: 03/28/2018 Elsevier Patient Education  2020 [place]: How to Protect Yourself and Others Know how it spreads  There is currently no vaccine to prevent coronavirus disease 2019 (COVID-19).  The best way to prevent illness is to avoid being exposed to this virus.  The virus is thought to spread mainly from person-to-person. ? Between people who are in close contact with one another (within about 6 feet). ? Through respiratory droplets produced when an infected person coughs, sneezes or talks. ? These droplets can land in  the mouths or noses of people who are nearby or possibly be inhaled into the lungs. ? Some recent studies have suggested that COVID-19 may be spread by people who are not showing symptoms. Everyone should Clean your hands often  Wash your hands often with soap and water for at least 20 seconds especially after you have been in a public place, or after blowing your nose, coughing, or sneezing.  If soap and water are not readily available, use a hand sanitizer that contains at least 60% alcohol. Cover all surfaces of your hands and rub them together until they feel dry.  Avoid touching your eyes, nose, and mouth with unwashed hands. Avoid close contact  Stay home if you are sick.  Avoid close contact with people who are sick.  Put distance between yourself and other people. ? Remember that some people without symptoms may be able to spread virus. ? This is especially important for people who are at higher risk of getting very GainPain.com.cy Cover your mouth and nose with a cloth face cover when around others  You could spread COVID-19 to others even if you do not feel sick.  Everyone should wear a  cloth face cover when they have to go out in public, for example to the grocery store or to pick up other necessities. ? Cloth face coverings should not be placed on young children under age 75, anyone who has trouble breathing, or is unconscious, incapacitated or otherwise unable to remove the mask without assistance.  The cloth face cover is meant to protect other people in case you are infected.  Do NOT use a facemask meant for a Dietitian.  Continue to keep about 6 feet between yourself and others. The cloth face cover is not a substitute for social distancing. Cover coughs and sneezes  If you are in a private setting and do not have on your cloth face covering, remember to always cover your mouth and nose with a tissue when you cough or sneeze or use the inside of your elbow.  Throw used tissues in the trash.  Immediately wash your hands with soap and water for at least 20 seconds. If soap and water are not readily available, clean your hands with a hand sanitizer that contains at least 60% alcohol. Clean and disinfect  Clean AND disinfect frequently touched surfaces daily. This includes tables, doorknobs, light switches, countertops, handles, desks, phones, keyboards, toilets, faucets, and sinks. RackRewards.fr  If surfaces are dirty, clean them: Use detergent or soap and water prior to disinfection.  Then, use a household disinfectant. You can see a list of EPA-registered household disinfectants here. michellinders.com 07/09/2018 This information is not intended to replace advice given to you by your health care provider. Make sure you discuss any questions you have with your health care provider. Document Released: 06/18/2018 Document Revised: 07/17/2018 Document Reviewed: 06/18/2018 Elsevier Patient Education  [place].

## 2018-09-18 NOTE — Progress Notes (Signed)
Report called to Terri, Therapist, sports at [facility]. Forms faxed and will await [facility] to transport. Will Continue to monitor.

## 2018-09-18 NOTE — Final Progress Note (Signed)
NURSING PROGRESS NOTE  Bob Burton [id number] Discharge Data: 09/18/2018 12:12 PM Attending Provider: Mendel Corning, MD [id number], Herbie Baltimore, MD     Nadara Eaton to be D/C'd Home per MD order.  Discussed with the patient the After Visit Summary and all questions fully answered. All IV's discontinued with no bleeding noted. All belongings returned to patient for patient to take home. Patient instructed to self quarantine at home for 14 days.   Last Vital Signs:  Blood pressure (!) 128/94, pulse 70, temperature 97.7 F (36.5 C), temperature source Oral, resp. rate 18, height 5\' 11"  (1.803 m), weight 120.4 kg, SpO2 93 %.  Discharge Medication List Allergies as of 09/18/2018   No Known Allergies     Medication List    TAKE these medications   acetaminophen 325 MG tablet Commonly known as: TYLENOL Take 2 tablets (650 mg total) by mouth every 6 (six) hours as needed for fever (fever >/= 101). OVER THE COUNTER   albuterol 108 (90 Base) MCG/ACT inhaler Commonly known as: VENTOLIN HFA Inhale 2 puffs into the lungs every 6 (six) hours as needed for wheezing or shortness of breath.   ascorbic acid 1000 MG tablet Commonly known as: VITAMIN C Take 1 tablet (1,000 mg total) by mouth daily. Start taking on: September 19, 2018   aspirin EC 81 MG tablet Take 1 tablet (81 mg total) by mouth daily.   chlorpheniramine-HYDROcodone 10-8 MG/5ML Suer Commonly known as: TUSSIONEX Take 5 mLs by mouth every 12 (twelve) hours as needed for cough.   CYANOCOBALAMIN PO Take 1 tablet by mouth daily.   levothyroxine 150 MCG tablet Commonly known as: SYNTHROID Take 150 mcg by mouth daily before breakfast.   lisinopril-hydrochlorothiazide 10-12.5 MG tablet Commonly known as: ZESTORETIC Take 1 tablet by mouth every morning.   multivitamin with minerals Tabs tablet Take 1 tablet by mouth daily. Start taking on: September 19, 2018   predniSONE 10 MG tablet Commonly known as:  DELTASONE Prednisone dosing: Take  Prednisone 40mg  (4 tabs) x 3 days, then taper to 30mg  (3 tabs) x 3 days, then 20mg  (2 tabs) x 3days, then 10mg  (1 tab) x 3days, then OFF.   Vitamin D3 25 MCG tablet Commonly known as: Vitamin D Take 1 tablet (1,000 Units total) by mouth daily. Start taking on: September 19, 2018 What changed:   medication strength  how much to take   zinc sulfate 220 (50 Zn) MG capsule Take 1 capsule (220 mg total) by mouth daily. Start taking on: September 19, 2018

## 2018-09-19 LAB — INTERLEUKIN-6, PLASMA: Interleukin-6, Plasma: 16.8 pg/mL — ABNORMAL HIGH (ref 0.0–12.2)

## 2018-09-20 LAB — CULTURE, BLOOD (ROUTINE X 2)
Culture: NO GROWTH
Culture: NO GROWTH

## 2018-10-29 ENCOUNTER — Telehealth: Payer: Self-pay | Admitting: *Deleted

## 2018-10-29 NOTE — Telephone Encounter (Signed)
Per Dr. Irene Limbo - can reschedule patient's lab/phelbotomy appts for 4-6 weeks out - allow patient recovery time from hospitalization in July for Covid 19. Contacted patient and patient in agreement. Message sent to scheduling.

## 2018-10-30 ENCOUNTER — Inpatient Hospital Stay: Payer: Commercial Managed Care - PPO

## 2018-10-30 ENCOUNTER — Telehealth: Payer: Self-pay | Admitting: Hematology

## 2018-10-30 NOTE — Telephone Encounter (Signed)
Scheduled appt per 8/26 sch message - pt aware of new appt date and time

## 2018-11-27 ENCOUNTER — Inpatient Hospital Stay: Payer: Commercial Managed Care - PPO | Attending: Hematology

## 2018-11-27 ENCOUNTER — Inpatient Hospital Stay: Payer: Commercial Managed Care - PPO

## 2018-11-27 ENCOUNTER — Other Ambulatory Visit: Payer: Self-pay

## 2018-11-27 DIAGNOSIS — R945 Abnormal results of liver function studies: Secondary | ICD-10-CM

## 2018-11-27 LAB — CMP (CANCER CENTER ONLY)
ALT: 147 U/L — ABNORMAL HIGH (ref 0–44)
AST: 98 U/L — ABNORMAL HIGH (ref 15–41)
Albumin: 4.1 g/dL (ref 3.5–5.0)
Alkaline Phosphatase: 82 U/L (ref 38–126)
Anion gap: 7 (ref 5–15)
BUN: 17 mg/dL (ref 8–23)
CO2: 25 mmol/L (ref 22–32)
Calcium: 9.8 mg/dL (ref 8.9–10.3)
Chloride: 109 mmol/L (ref 98–111)
Creatinine: 1.11 mg/dL (ref 0.61–1.24)
GFR, Est AFR Am: >60 mL/min (ref >60)
GFR, Estimated: >60 mL/min (ref >60)
Glucose, Bld: 201 mg/dL — ABNORMAL HIGH (ref 70–99)
Potassium: 4.1 mmol/L (ref 3.5–5.1)
Sodium: 141 mmol/L (ref 135–145)
Total Bilirubin: 0.7 mg/dL (ref 0.3–1.2)
Total Protein: 7 g/dL (ref 6.5–8.1)

## 2018-11-27 LAB — CBC WITH DIFFERENTIAL/PLATELET
Abs Immature Granulocytes: 0.02 10*3/uL (ref 0.00–0.07)
Basophils Absolute: 0.1 10*3/uL (ref 0.0–0.1)
Basophils Relative: 1 %
Eosinophils Absolute: 0.2 10*3/uL (ref 0.0–0.5)
Eosinophils Relative: 4 %
HCT: 49.6 % (ref 39.0–52.0)
Hemoglobin: 17.2 g/dL — ABNORMAL HIGH (ref 13.0–17.0)
Immature Granulocytes: 0 %
Lymphocytes Relative: 47 %
Lymphs Abs: 2.5 10*3/uL (ref 0.7–4.0)
MCH: 31.3 pg (ref 26.0–34.0)
MCHC: 34.7 g/dL (ref 30.0–36.0)
MCV: 90.3 fL (ref 80.0–100.0)
Monocytes Absolute: 0.5 10*3/uL (ref 0.1–1.0)
Monocytes Relative: 9 %
Neutro Abs: 2.2 10*3/uL (ref 1.7–7.7)
Neutrophils Relative %: 39 %
Platelets: 182 10*3/uL (ref 150–400)
RBC: 5.49 MIL/uL (ref 4.22–5.81)
RDW: 13.6 % (ref 11.5–15.5)
WBC: 5.5 10*3/uL (ref 4.0–10.5)
nRBC: 0 % (ref 0.0–0.2)

## 2018-11-27 LAB — FERRITIN: Ferritin: 279 ng/mL (ref 24–336)

## 2018-11-27 NOTE — Progress Notes (Signed)
Jukka-Petri E Tritz presents today for phlebotomy per MD orders. Attempted phlebotomy via 18 gauge needle to right AC with 25 ml of blood out; PIV clotted and removed without difficulty. Gauze dressing applied and pt agreeable to try again for full phlebotomy.  Phlebotomy procedure started at 1127 and ended at 1132. 535 grams removed. Patient observed for 30 minutes after procedure without any incident.  Pt given post procedure snacks and drinks. Patient tolerated procedure well. IV needle removed intact.

## 2018-11-27 NOTE — Patient Instructions (Signed)

## 2019-04-01 ENCOUNTER — Ambulatory Visit: Payer: Commercial Managed Care - PPO

## 2019-04-10 ENCOUNTER — Ambulatory Visit: Payer: Commercial Managed Care - PPO

## 2019-04-22 ENCOUNTER — Ambulatory Visit: Payer: Commercial Managed Care - PPO

## 2019-05-01 ENCOUNTER — Other Ambulatory Visit: Payer: Self-pay | Admitting: *Deleted

## 2019-05-02 ENCOUNTER — Inpatient Hospital Stay: Payer: Commercial Managed Care - PPO

## 2019-05-02 ENCOUNTER — Telehealth: Payer: Self-pay | Admitting: Hematology

## 2019-05-02 ENCOUNTER — Other Ambulatory Visit: Payer: Self-pay

## 2019-05-02 ENCOUNTER — Inpatient Hospital Stay (HOSPITAL_BASED_OUTPATIENT_CLINIC_OR_DEPARTMENT_OTHER): Payer: Commercial Managed Care - PPO | Admitting: Hematology

## 2019-05-02 ENCOUNTER — Inpatient Hospital Stay: Payer: Commercial Managed Care - PPO | Attending: Hematology

## 2019-05-02 DIAGNOSIS — R945 Abnormal results of liver function studies: Secondary | ICD-10-CM

## 2019-05-02 LAB — CMP (CANCER CENTER ONLY)
ALT: 66 U/L — ABNORMAL HIGH (ref 0–44)
AST: 38 U/L (ref 15–41)
Albumin: 3.9 g/dL (ref 3.5–5.0)
Alkaline Phosphatase: 69 U/L (ref 38–126)
Anion gap: 9 (ref 5–15)
BUN: 20 mg/dL (ref 8–23)
CO2: 26 mmol/L (ref 22–32)
Calcium: 9.1 mg/dL (ref 8.9–10.3)
Chloride: 106 mmol/L (ref 98–111)
Creatinine: 1.18 mg/dL (ref 0.61–1.24)
GFR, Est AFR Am: >60 mL/min (ref >60)
GFR, Estimated: >60 mL/min (ref >60)
Glucose, Bld: 101 mg/dL — ABNORMAL HIGH (ref 70–99)
Potassium: 3.9 mmol/L (ref 3.5–5.1)
Sodium: 141 mmol/L (ref 135–145)
Total Bilirubin: 1 mg/dL (ref 0.3–1.2)
Total Protein: 7.2 g/dL (ref 6.5–8.1)

## 2019-05-02 LAB — CBC WITH DIFFERENTIAL (CANCER CENTER ONLY)
Abs Immature Granulocytes: 0.02 10*3/uL (ref 0.00–0.07)
Basophils Absolute: 0.1 10*3/uL (ref 0.0–0.1)
Basophils Relative: 1 %
Eosinophils Absolute: 0.2 10*3/uL (ref 0.0–0.5)
Eosinophils Relative: 3 %
HCT: 48 % (ref 39.0–52.0)
Hemoglobin: 16.7 g/dL (ref 13.0–17.0)
Immature Granulocytes: 0 %
Lymphocytes Relative: 41 %
Lymphs Abs: 2.5 10*3/uL (ref 0.7–4.0)
MCH: 30.7 pg (ref 26.0–34.0)
MCHC: 34.8 g/dL (ref 30.0–36.0)
MCV: 88.2 fL (ref 80.0–100.0)
Monocytes Absolute: 0.6 10*3/uL (ref 0.1–1.0)
Monocytes Relative: 10 %
Neutro Abs: 2.7 10*3/uL (ref 1.7–7.7)
Neutrophils Relative %: 45 %
Platelet Count: 170 10*3/uL (ref 150–400)
RBC: 5.44 MIL/uL (ref 4.22–5.81)
RDW: 13.3 % (ref 11.5–15.5)
WBC Count: 6.1 10*3/uL (ref 4.0–10.5)
nRBC: 0 % (ref 0.0–0.2)

## 2019-05-02 LAB — FERRITIN: Ferritin: 170 ng/mL (ref 24–336)

## 2019-05-02 NOTE — Telephone Encounter (Signed)
Scheduled appts per 2/26 los.

## 2019-05-02 NOTE — Progress Notes (Signed)
[name]    HEMATOLOGY/ONCOLOGY CLINIC NOTE  Date of Service: 05/02/19  PCP: Dr Aura Dials MD   CHIEF COMPLAINTS/PURPOSE OF CONSULTATION:  F/u for mx of hemochromatosis  HISTORY OF PRESENTING ILLNESS:   Bob Burton is a wonderful 64 y.o. male who has been referred to [place] by Dr Aura Dials MD for evaluation and management of elevated ferritin with concern for hemochromatosis.  Patient has a history of hypertension, sleep apnea, hypothyroidism, gout who on routine labs with his primary care physician was noted to have was noted to have abnormal liver function tests in December 2015. At the time his AST was 73 and ALT was 124 with a normal bilirubin and alkaline phosphatase. With an ultrasound of the abdomen done on 02/02/2014 showing diffuse and markedly increased echogenicity of the liver or definition of the liver architecture suggestive of severe fatty infiltration of the liver. No focal hepatic lesions or intrahepatic biliary dilatation was noted. Spleen was noted to be normal in size and unremarkable. Workup at the time also showed that his ferritin level was 791.2 with an iron saturation of 41%.  He has been on treatment for his dyslipidemia. He notes that he is being followed by a liver specialist for management of his abnormal liver functions. He reports that he donated PRBCs every 2 months for 15 years until he moved to the [place] in 1997. After this he could not been a blood concerns with Mad cow disease in [place].  Patient notes no abdominal pain or distention. Does not report a clear family history of hemochromatosis or iron overload disorders or early heart disease or liver cirrhosis. His father however was disease due to suicide and it is unclear if he had any significant health problems.  INTERVAL HISTORY  Bob E Veron is here for management and evaluation of his hemochromatosis. The patient's last visit with [place] was on 05/01/2018. The pt reports that  he is doing well overall.  The pt reports he has no new concerns. He was in the hospital for 5 days due to [place] in July 2020, but has no remaining symptoms. He was taking a multi-vitamin during period of having COVID19 but stopped taking it after. Pt reports that his hypothyroidism is stable.    Lab results today (05/02/19) of CBC w/diff and CMP is as follows: all values are WNL except for Glucose at 101, ALT at 66. 05/02/2019 of Ferritin at 170  On review of systems, pt reports healthy appetite and denies cough, SOB and any other symptoms.   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 Now uses an oral device. 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  #8 elevated liver function tests  #9 history of gout #10 gastroesophageal reflux  SURGICAL HISTORY:  #1 right wrist surgery #2 reports he is scheduled for carpal tunnel syndrome surgery #3 Lasik 2007  SOCIAL HISTORY: Social History   Socioeconomic History  . Marital status: Married    Spouse name: Not on file  . Number of children: Not on file  . Years of education: Not on file  . Highest education level: Not on file  Occupational History  . Not on file  Tobacco Use  . Smoking status: Never Smoker  . Smokeless tobacco: Never Used  Substance and Sexual Activity  . Alcohol use: No  .  Drug use: No  . Sexual activity: Not on file  Other Topics Concern  . Not on file  Social History Narrative  . Not on file   Social Determinants of Health   Financial Resource Strain:   . Difficulty of Paying Living Expenses: Not on file  Food Insecurity:   . Worried About Charity fundraiser in the Last Year: Not on file  . Ran Out of Food in the Last Year: Not on file  Transportation Needs:    . Lack of Transportation (Medical): Not on file  . Lack of Transportation (Non-Medical): Not on file  Physical Activity:   . Days of Exercise per Week: Not on file  . Minutes of Exercise per Session: Not on file  Stress:   . Feeling of Stress : Not on file  Social Connections:   . Frequency of Communication with Friends and Family: Not on file  . Frequency of Social Gatherings with Friends and Family: Not on file  . Attends Religious Services: Not on file  . Active Member of Clubs or Organizations: Not on file  . Attends Archivist Meetings: Not on file  . Marital Status: Not on file  Intimate Partner Violence:   . Fear of Current or Ex-Partner: Not on file  . Emotionally Abused: Not on file  . Physically Abused: Not on file  . 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  FAMILY HISTORY: Patient is originally from [place]  Father deceased-from suicide Mother 58 year old  ALLERGIES:  has No Known Allergies.  MEDICATIONS:  Current Outpatient Medications  Medication Sig Dispense Refill  . acetaminophen (TYLENOL) 325 MG tablet Take 2 tablets (650 mg total) by mouth every 6 (six) hours as needed for fever (fever >/= 101). OVER THE COUNTER 30 tablet 0  . albuterol (VENTOLIN HFA) 108 (90 Base) MCG/ACT inhaler Inhale 2 puffs into the lungs every 6 (six) hours as needed for wheezing or shortness of breath. 16 g 3  . chlorpheniramine-HYDROcodone (TUSSIONEX) 10-8 MG/5ML SUER Take 5 mLs by mouth every 12 (twelve) hours as needed for cough. 115 mL 0  . cholecalciferol (VITAMIN D) 25 MCG tablet Take 1 tablet (1,000 Units total) by mouth daily. 30 tablet 3  . CYANOCOBALAMIN  PO Take 1 tablet by mouth daily.    [name] levothyroxine (SYNTHROID, LEVOTHROID) 150 MCG tablet Take 150 mcg by mouth daily before breakfast.     . lisinopril-hydrochlorothiazide (PRINZIDE,ZESTORETIC) 10-12.5 MG tablet Take 1 tablet by mouth every morning.   0  . Multiple Vitamin (MULTIVITAMIN WITH MINERALS) TABS tablet Take 1 tablet by mouth daily. 30 tablet 3  . predniSONE (DELTASONE) 10 MG tablet Prednisone dosing: Take  Prednisone 24m (4 tabs) x 3 days, then taper to 320m(3 tabs) x 3 days, then 207m2 tabs) x 3days, then 38m42m tab) x 3days, then OFF. 30 tablet 0  . vitamin C (VITAMIN C) 1000 MG tablet Take 1 tablet (1,000 mg total) by mouth daily. 30 tablet 3  . zinc sulfate 220 (50 Zn) MG capsule Take 1 capsule (220 mg total) by mouth daily. 30 capsule 3   No current facility-administered medications for this visit.  REVIEW OF SYSTEMS:   A 10+ POINT REVIEW OF SYSTEMS WAS OBTAINED including neurology, dermatology, psychiatry, cardiac, respiratory, lymph, extremities, GI, GU, Musculoskeletal, constitutional, breasts, reproductive, HEENT.  All pertinent positives are noted in the HPI.  All others are negative.   PHYSICAL EXAMINATION: ECOG PERFORMANCE STATUS: 0 - Asymptomatic  Vitals:   05/02/19 1220  BP: 110/80  Pulse: 81  Resp: 18  Temp: 98.4 F (36.9 C)  SpO2: 98%   Filed Weights   05/02/19 1220  Weight: 276 lb 12.8 oz (125.6 kg)   .Body mass index is 38.61 kg/m.   GENERAL:alert, in no acute distress and comfortable SKIN: no acute rashes, no significant lesions EYES: conjunctiva are pink and non-injected, sclera anicteric OROPHARYNX: MMM, no exudates, no oropharyngeal erythema or ulceration NECK: supple, no JVD LYMPH:  no palpable lymphadenopathy in the cervical, axillary or inguinal regions LUNGS: clear to auscultation b/l with normal respiratory effort HEART: regular rate & rhythm ABDOMEN:  normoactive bowel sounds , non tender, not distended. No palpable  hepatosplenomegaly.  Extremity: no pedal edema PSYCH: alert & oriented x 3 with fluent speech NEURO: no focal motor/sensory deficits  LABORATORY DATA:  I have reviewed the data as listed  . CBC Latest Ref Rng & Units 05/02/2019 11/27/2018 09/18/2018  WBC 4.0 - 10.5 K/uL 6.1 5.5 15.7(H)  Hemoglobin 13.0 - 17.0 g/dL 16.7 17.2(H) 17.1(H)  Hematocrit 39.0 - 52.0 % 48.0 49.6 49.6  Platelets 150 - 400 K/uL 170 182 156   . CMP Latest Ref Rng & Units 05/02/2019 11/27/2018 09/18/2018  Glucose 70 - 99 mg/dL 101(H) 201(H) 177(H)  BUN 8 - 23 mg/dL 20 17 26(H)  Creatinine 0.61 - 1.24 mg/dL 1.18 1.11 0.94  Sodium 135 - 145 mmol/L 141 141 139  Potassium 3.5 - 5.1 mmol/L 3.9 4.1 4.2  Chloride 98 - 111 mmol/L 106 109 108  CO2 22 - 32 mmol/L 26 25 19(L)  Calcium 8.9 - 10.3 mg/dL 9.1 9.8 8.6(L)  Total Protein 6.5 - 8.1 g/dL 7.2 7.0 6.6  Total Bilirubin 0.3 - 1.2 mg/dL 1.0 0.7 0.7  Alkaline Phos 38 - 126 U/L 69 82 72  AST 15 - 41 U/L 38 98(H) 59(H)  ALT 0 - 44 U/L 66(H) 147(H) 97(H)   . Lab Results  Component Value Date   IRON 95 10/31/2017   TIBC 343 10/31/2017   IRONPCTSAT 28 (L) 10/31/2017   (Iron and TIBC)  Lab Results  Component Value Date   FERRITIN 170 05/02/2019           RADIOGRAPHIC STUDIES: I have personally reviewed the radiological images as listed and agreed with the findings in the report. No results found.  ASSESSMENT & PLAN:   64 y.o. 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 Patient's ferritin at baseline appears to be around 700's. It was down to the 46 after serial therapeutic phlebotomies and now is back up to 129 As noted about he was a blood donor for 15 years prior to 1997 which was probably somewhat protective. Most patients with isolated H63D carrier state did not develop overt clinical presentations of hemochromatosis. Patient does not report any overt joint issues. Has had no cardiac issues or  cardiac arrhythmias. Has had elevated liver function tests attributable both to steatohepatitis.  #2 Elevated liver function tests Based on liver biopsy is predominantly from steatohepatitis with stage I fibrosis. No overt evidence of iron overload. Patient has a liver specialist that he  is seen by Roosevelt Locks NP) LFTs stable.  PLAN: -Discussed pt labwork today, 05/02/19; blood counts is normal Glucose at 101, ALT at 66. - Liver enzymes have improved  -Discussed 05/02/2019: Ferratin is 170, Goal for Ferritin <100 -Discussed affects of COVID19 on blood chemistries including Ferritin and elevated LFTs -Recommended not taking Multi-vitamin with iron but would be able to take B-complex  -Will get ultra-sound abd every 6 months to year for [facility] screening -Proceed with therapeutic phlebotomy today and every 6 months -Avoid other medications that could cause liver injury -Will get [place] Abd in 1-2 weeks -Will see the pt back in 12 months with labs    #3 hypertension, dyslipidemia, hypothyroidism, sleep apnea, GERD -Continue management as per primary care physician   FOLLOW UP: [place] abd in 1-2 weeks Please schedule for next 3 therapeutic phlebotomies every 6 months with labs MD visit in 12 months   The total time spent in the appt was 15 minutes and more than 50% was on counseling and direct patient cares.  All of the patient's questions were answered with apparent satisfaction. The patient knows to call the clinic with any problems, questions or concerns.    Sullivan Lone MD [place] AAHIVMS [facility] Hematology/Oncology Physician [facility]  (Office):       [phone] (Work cell):  [phone] (Fax):           [phone]  05/02/2019 1:32 PM   I, Yevette Edwards, am acting as a scribe for Dr. Sullivan Lone.   .I have reviewed the above documentation for accuracy and completeness, and I agree with the above. Brunetta Genera MD

## 2019-05-02 NOTE — Patient Instructions (Signed)
Therapeutic Phlebotomy Therapeutic phlebotomy is the planned removal of blood from a person's body for the purpose of treating a medical condition. The procedure is similar to donating blood. Usually, about a pint (470 mL, or 0.47 L) of blood is removed. The average adult has 9-12 pints (4.3-5.7 L) of blood in the body. Therapeutic phlebotomy may be used to treat the following medical conditions:  Hemochromatosis. This is a condition in which the blood contains too much iron.  Polycythemia vera. This is a condition in which the blood contains too many red blood cells.  Porphyria cutanea tarda. This is a disease in which an important part of hemoglobin is not made properly. It results in the buildup of abnormal amounts of porphyrins in the body.  Sickle cell disease. This is a condition in which the red blood cells form an abnormal crescent shape rather than a round shape. Tell a health care provider about:  Any allergies you have.  All medicines you are taking, including vitamins, herbs, eye drops, creams, and over-the-counter medicines.  Any problems you or family members have had with anesthetic medicines.  Any blood disorders you have.  Any surgeries you have had.  Any medical conditions you have.  Whether you are pregnant or may be pregnant. What are the risks? Generally, this is a safe procedure. However, problems may occur, including:  Nausea or light-headedness.  Low blood pressure (hypotension).  Soreness, bleeding, swelling, or bruising at the needle insertion site.  Infection. What happens before the procedure?  Follow instructions from your health care provider about eating or drinking restrictions.  Ask your health care provider about: ? Changing or stopping your regular medicines. This is especially important if you are taking diabetes medicines or blood thinners (anticoagulants). ? Taking medicines such as aspirin and ibuprofen. These medicines can thin your  blood. Do not take these medicines unless your health care provider tells you to take them. ? Taking over-the-counter medicines, vitamins, herbs, and supplements.  Wear clothing with sleeves that can be raised above the elbow.  Plan to have someone take you home from the hospital or clinic.  You may have a blood sample taken.  Your blood pressure, pulse rate, and breathing rate will be measured. What happens during the procedure?   To lower your risk of infection: ? Your health care team will wash or sanitize their hands. ? Your skin will be cleaned with an antiseptic.  You may be given a medicine to numb the area (local anesthetic).  A tourniquet will be placed on your arm.  A needle will be inserted into one of your veins.  Tubing and a collection bag will be attached to that needle.  Blood will flow through the needle and tubing into the collection bag.  The collection bag will be placed lower than your arm to allow gravity to help the flow of blood into the bag.  You may be asked to open and close your hand slowly and continually during the entire collection.  After the specified amount of blood has been removed from your body, the collection bag and tubing will be clamped.  The needle will be removed from your vein.  Pressure will be held on the site of the needle insertion to stop the bleeding.  A bandage (dressing) will be placed over the needle insertion site. The procedure may vary among health care providers and hospitals. What happens after the procedure?  Your blood pressure, pulse rate, and breathing rate will be   measured after the procedure.  You will be encouraged to drink fluids.  Your recovery will be assessed and monitored.  You can return to your normal activities as told by your health care provider. Summary  Therapeutic phlebotomy is the planned removal of blood from a person's body for the purpose of treating a medical condition.  Therapeutic  phlebotomy may be used to treat hemochromatosis, polycythemia vera, porphyria cutanea tarda, or sickle cell disease.  In the procedure, a needle is inserted and about a pint (470 mL, or 0.47 L) of blood is removed. The average adult has 9-12 pints (4.3-5.7 L) of blood in the body.  This is generally a safe procedure, but it can sometimes cause problems such as nausea, light-headedness, or low blood pressure (hypotension). This information is not intended to replace advice given to you by your health care provider. Make sure you discuss any questions you have with your health care provider. Document Revised: 03/08/2017 Document Reviewed: 03/08/2017 Elsevier Patient Education  2020 Elsevier Inc.  

## 2019-05-03 LAB — AFP TUMOR MARKER: AFP, Serum, Tumor Marker: 6.2 ng/mL (ref 0.0–8.3)

## 2019-05-08 ENCOUNTER — Ambulatory Visit: Payer: Commercial Managed Care - PPO | Attending: Internal Medicine

## 2019-05-08 DIAGNOSIS — Z23 Encounter for immunization: Secondary | ICD-10-CM | POA: Insufficient documentation

## 2019-05-08 NOTE — Progress Notes (Signed)
   Covid-19 Vaccination Clinic  Name:  Bob Burton    MRN: [id number] DOB: 1956/01/12  05/08/2019  Bob Burton was observed post Covid-19 immunization for 15 minutes without incident. He was provided with Vaccine Information Sheet and instruction to access the V-Safe system.   Bob Burton was instructed to call 911 with any severe reactions post vaccine: [name] Difficulty breathing  . Swelling of face and throat  . A fast heartbeat  . A bad rash all over body  . Dizziness and weakness

## 2019-06-04 ENCOUNTER — Ambulatory Visit: Payer: Commercial Managed Care - PPO | Attending: Internal Medicine

## 2019-06-04 DIAGNOSIS — Z23 Encounter for immunization: Secondary | ICD-10-CM

## 2019-06-04 NOTE — Progress Notes (Signed)
   Covid-19 Vaccination Clinic  Name:  Bob Burton    MRN: [id number] DOB: 08/18/1955  06/04/2019  [name] was observed post Covid-19 immunization for 15 minutes without incident. He was provided with Vaccine Information Sheet and instruction to access the V-Safe system.   [name] was instructed to call 911 with any severe reactions post vaccine: [name] Difficulty breathing  . Swelling of face and throat  . A fast heartbeat  . A bad rash all over body  . Dizziness and weakness   Immunizations Administered    Name Date Dose VIS Date Route   Pfizer COVID-19 Vaccine 06/04/2019  8:32 AM 0.3 mL 02/14/2019 Intramuscular   Manufacturer: [place]   Lot: [id number]   [place]: [id number]

## 2019-09-18 ENCOUNTER — Other Ambulatory Visit: Payer: Self-pay | Admitting: Nurse Practitioner

## 2019-09-18 DIAGNOSIS — K7581 Nonalcoholic steatohepatitis (NASH): Secondary | ICD-10-CM

## 2019-09-29 ENCOUNTER — Ambulatory Visit
Admission: RE | Admit: 2019-09-29 | Discharge: 2019-09-29 | Disposition: A | Discharge status: Home / Self Care | Payer: Commercial Managed Care - PPO | Source: Ambulatory Visit | Attending: Nurse Practitioner | Admitting: Nurse Practitioner

## 2019-09-29 DIAGNOSIS — K7581 Nonalcoholic steatohepatitis (NASH): Secondary | ICD-10-CM

## 2019-10-30 ENCOUNTER — Other Ambulatory Visit: Payer: Self-pay

## 2019-10-30 ENCOUNTER — Inpatient Hospital Stay: Payer: Commercial Managed Care - PPO

## 2019-10-30 ENCOUNTER — Inpatient Hospital Stay: Payer: Commercial Managed Care - PPO | Attending: Hematology

## 2019-10-30 LAB — FERRITIN: Ferritin: 166 ng/mL (ref 24–336)

## 2019-10-30 LAB — CBC WITH DIFFERENTIAL/PLATELET
Abs Immature Granulocytes: 0.02 10*3/uL (ref 0.00–0.07)
Basophils Absolute: 0.1 10*3/uL (ref 0.0–0.1)
Basophils Relative: 1 %
Eosinophils Absolute: 0.2 10*3/uL (ref 0.0–0.5)
Eosinophils Relative: 3 %
HCT: 48.1 % (ref 39.0–52.0)
Hemoglobin: 17 g/dL (ref 13.0–17.0)
Immature Granulocytes: 0 %
Lymphocytes Relative: 40 %
Lymphs Abs: 2.6 10*3/uL (ref 0.7–4.0)
MCH: 30.9 pg (ref 26.0–34.0)
MCHC: 35.3 g/dL (ref 30.0–36.0)
MCV: 87.5 fL (ref 80.0–100.0)
Monocytes Absolute: 0.9 10*3/uL (ref 0.1–1.0)
Monocytes Relative: 14 %
Neutro Abs: 2.7 10*3/uL (ref 1.7–7.7)
Neutrophils Relative %: 42 %
Platelets: 185 10*3/uL (ref 150–400)
RBC: 5.5 MIL/uL (ref 4.22–5.81)
RDW: 13.4 % (ref 11.5–15.5)
WBC: 6.4 10*3/uL (ref 4.0–10.5)
nRBC: 0 % (ref 0.0–0.2)

## 2019-10-30 NOTE — Progress Notes (Signed)
Bob Burton presents today for phlebotomy per MD orders. Phlebotomy procedure started at 1247 and ended at 1301 using 16 G phlebotomy kit with 501 grams removed.IV needle removed intact. Patient tolerated procedure well.  Patient provided nourishment and drink, and was observed for 30 minutes after procedure without any incident.

## 2019-10-30 NOTE — Patient Instructions (Signed)
Therapeutic Phlebotomy Therapeutic phlebotomy is the planned removal of blood from a person's body for the purpose of treating a medical condition. The procedure is similar to donating blood. Usually, about a pint (470 mL, or 0.47 L) of blood is removed. The average adult has 9-12 pints (4.3-5.7 L) of blood in the body. Therapeutic phlebotomy may be used to treat the following medical conditions:  Hemochromatosis. This is a condition in which the blood contains too much iron.  Polycythemia vera. This is a condition in which the blood contains too many red blood cells.  Porphyria cutanea tarda. This is a disease in which an important part of hemoglobin is not made properly. It results in the buildup of abnormal amounts of porphyrins in the body.  Sickle cell disease. This is a condition in which the red blood cells form an abnormal crescent shape rather than a round shape. Tell a health care provider about:  Any allergies you have.  All medicines you are taking, including vitamins, herbs, eye drops, creams, and over-the-counter medicines.  Any problems you or family members have had with anesthetic medicines.  Any blood disorders you have.  Any surgeries you have had.  Any medical conditions you have.  Whether you are pregnant or may be pregnant. What are the risks? Generally, this is a safe procedure. However, problems may occur, including:  Nausea or light-headedness.  Low blood pressure (hypotension).  Soreness, bleeding, swelling, or bruising at the needle insertion site.  Infection. What happens before the procedure?  Follow instructions from your health care provider about eating or drinking restrictions.  Ask your health care provider about: ? Changing or stopping your regular medicines. This is especially important if you are taking diabetes medicines or blood thinners (anticoagulants). ? Taking medicines such as aspirin and ibuprofen. These medicines can thin your  blood. Do not take these medicines unless your health care provider tells you to take them. ? Taking over-the-counter medicines, vitamins, herbs, and supplements.  Wear clothing with sleeves that can be raised above the elbow.  Plan to have someone take you home from the hospital or clinic.  You may have a blood sample taken.  Your blood pressure, pulse rate, and breathing rate will be measured. What happens during the procedure?   To lower your risk of infection: ? Your health care team will wash or sanitize their hands. ? Your skin will be cleaned with an antiseptic.  You may be given a medicine to numb the area (local anesthetic).  A tourniquet will be placed on your arm.  A needle will be inserted into one of your veins.  Tubing and a collection bag will be attached to that needle.  Blood will flow through the needle and tubing into the collection bag.  The collection bag will be placed lower than your arm to allow gravity to help the flow of blood into the bag.  You may be asked to open and close your hand slowly and continually during the entire collection.  After the specified amount of blood has been removed from your body, the collection bag and tubing will be clamped.  The needle will be removed from your vein.  Pressure will be held on the site of the needle insertion to stop the bleeding.  A bandage (dressing) will be placed over the needle insertion site. The procedure may vary among health care providers and hospitals. What happens after the procedure?  Your blood pressure, pulse rate, and breathing rate will be   measured after the procedure.  You will be encouraged to drink fluids.  Your recovery will be assessed and monitored.  You can return to your normal activities as told by your health care provider. Summary  Therapeutic phlebotomy is the planned removal of blood from a person's body for the purpose of treating a medical condition.  Therapeutic  phlebotomy may be used to treat hemochromatosis, polycythemia vera, porphyria cutanea tarda, or sickle cell disease.  In the procedure, a needle is inserted and about a pint (470 mL, or 0.47 L) of blood is removed. The average adult has 9-12 pints (4.3-5.7 L) of blood in the body.  This is generally a safe procedure, but it can sometimes cause problems such as nausea, light-headedness, or low blood pressure (hypotension). This information is not intended to replace advice given to you by your health care provider. Make sure you discuss any questions you have with your health care provider. Document Revised: 03/08/2017 Document Reviewed: 03/08/2017 Elsevier Patient Education  2020 Elsevier Inc.  

## 2019-10-31 ENCOUNTER — Inpatient Hospital Stay: Payer: Commercial Managed Care - PPO

## 2020-04-29 NOTE — Progress Notes (Signed)
[name]    HEMATOLOGY/ONCOLOGY CLINIC NOTE  Date of Service: 04/29/20  PCP: Dr Aura Dials MD   CHIEF COMPLAINTS/PURPOSE OF CONSULTATION:  F/u for mx of hemochromatosis  HISTORY OF PRESENTING ILLNESS:   Bob Burton is a wonderful 65 y.o. male who has been referred to [place] by Dr Aura Dials MD for evaluation and management of elevated ferritin with concern for hemochromatosis.  Patient has a history of hypertension, sleep apnea, hypothyroidism, gout who on routine labs with his primary care physician was noted to have was noted to have abnormal liver function tests in December 2015. At the time his AST was 73 and ALT was 124 with a normal bilirubin and alkaline phosphatase. With an ultrasound of the abdomen done on 02/02/2014 showing diffuse and markedly increased echogenicity of the liver or definition of the liver architecture suggestive of severe fatty infiltration of the liver. No focal hepatic lesions or intrahepatic biliary dilatation was noted. Spleen was noted to be normal in size and unremarkable. Workup at the time also showed that his ferritin level was 791.2 with an iron saturation of 41%.  He has been on treatment for his dyslipidemia. He notes that he is being followed by a liver specialist for management of his abnormal liver functions. He reports that he donated PRBCs every 2 months for 15 years until he moved to the [place] in 1997. After this he could not been a blood concerns with Mad cow disease in [place].  Patient notes no abdominal pain or distention. Does not report a clear family history of hemochromatosis or iron overload disorders or early heart disease or liver cirrhosis. His father however was disease due to suicide and it is unclear if he had any significant health problems.  INTERVAL HISTORY Bob Burton is here for management and evaluation of his hemochromatosis. The patient's last visit with [place] was on 05/02/2019. The pt reports that  he is doing well overall.  The pt reports no new symptoms or concerns over the last year. He notes no medical changes. He is off most of his medications listed.   Lab results today 04/30/2020 of CBC w/diff and CMP is as follows: all values are WNL except for  Hgb of 17.1, Glucose of 103, Creatinine of 1.36, ALT of 66, GFR est of 57. 04/30/2020 AFP Tumor Marker in progress. 04/30/2020 Ferritin of 121.  On review of systems, pt denies abdominal pain, back pain, leg swelling, SOB, and any other symptoms.  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 Now uses an oral device. 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  #8 elevated liver function tests  #9 history of gout #10 gastroesophageal reflux  SURGICAL HISTORY:  #1 right wrist surgery #2 reports he is scheduled for carpal tunnel syndrome surgery #3 Lasik 2007  SOCIAL HISTORY: Social History   Socioeconomic History  . Marital status: Married    Spouse name: Not on file  . Number of children: Not on file  . Years of education: Not on file  . Highest education level: Not on file  Occupational History  . Not on file  Tobacco Use  . Smoking status: Never Smoker  . Smokeless tobacco: Never Used  Vaping Use  . Vaping Use: Never used  Substance and Sexual Activity  . Alcohol use: No  .  Drug use: No  . Sexual activity: Not on file  Other Topics Concern  . Not on file  Social History Narrative  . 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 He would from [place] to the [place] in 1997.  FAMILY HISTORY: Patient is originally from [place]  Father deceased-from suicide Mother 21 year old  ALLERGIES:  has No Known Allergies.  MEDICATIONS:  Current Outpatient Medications  Medication Sig Dispense Refill  . acetaminophen (TYLENOL) 325 MG tablet Take 2 tablets (650 mg total) by mouth every 6 (six) hours as needed for fever (fever >/= 101). OVER THE COUNTER 30 tablet 0  . albuterol (VENTOLIN HFA) 108 (90 Base) MCG/ACT inhaler Inhale 2 puffs into the lungs every 6 (six) hours as needed for wheezing or shortness of breath. 16 g 3  . chlorpheniramine-HYDROcodone (TUSSIONEX) 10-8 MG/5ML SUER Take 5 mLs by mouth every 12 (twelve) hours as needed for cough. 115 mL 0  . cholecalciferol (VITAMIN D) 25 MCG tablet Take 1 tablet (1,000 Units total) by mouth daily. 30 tablet 3  . CYANOCOBALAMIN PO Take 1 tablet by mouth daily.    [name] levothyroxine (SYNTHROID, LEVOTHROID) 150 MCG tablet Take 150 mcg by mouth daily before breakfast.     . lisinopril-hydrochlorothiazide (PRINZIDE,ZESTORETIC) 10-12.5 MG tablet Take 1 tablet by mouth every morning.   0  . Multiple Vitamin (MULTIVITAMIN WITH MINERALS) TABS tablet Take 1 tablet by mouth daily. 30 tablet 3  . predniSONE (DELTASONE) 10 MG tablet Prednisone dosing: Take  Prednisone 14m (4 tabs) x 3 days, then taper to 356m(3 tabs) x 3 days, then 2029m2 tabs) x 3days, then 98m28m tab) x 3days, then OFF. 30 tablet 0  . vitamin C (VITAMIN C) 1000 MG tablet Take 1 tablet (1,000 mg total) by mouth daily. 30 tablet 3  . zinc sulfate 220 (50 Zn) MG capsule Take 1 capsule (220 mg total) by mouth daily. 30 capsule 3   No current  facility-administered medications for this visit.    REVIEW OF SYSTEMS:   10 Point review of Systems was done is negative except as noted above.  PHYSICAL EXAMINATION: ECOG PERFORMANCE STATUS: 0 - Asymptomatic  Vitals:   04/30/20 1213  BP: 113/81  Pulse: 85  Resp: 16  Temp: (!) 97.5 F (36.4 C)  SpO2: 100%   Filed Weights   04/30/20 1213  Weight: 279 lb 3.2 oz (126.6 kg)   .Body mass index is 38.94 kg/m.   GENERAL:alert, in no acute distress and comfortable SKIN: no acute rashes, no significant lesions EYES: conjunctiva are pink and non-injected, sclera anicteric OROPHARYNX: MMM, no exudates, no oropharyngeal erythema or ulceration NECK: supple, no JVD LYMPH:  no palpable lymphadenopathy in the cervical, axillary or inguinal regions LUNGS: clear to auscultation b/l with normal respiratory effort HEART: regular rate & rhythm ABDOMEN:  normoactive bowel sounds , non tender, not distended. Extremity: no pedal edema PSYCH: alert & oriented x 3 with fluent  speech NEURO: no focal motor/sensory deficits  LABORATORY DATA:  I have reviewed the data as listed  . CBC Latest Ref Rng & Units 10/30/2019 05/02/2019 11/27/2018  WBC 4.0 - 10.5 K/uL 6.4 6.1 5.5  Hemoglobin 13.0 - 17.0 g/dL 17.0 16.7 17.2(H)  Hematocrit 39.0 - 52.0 % 48.1 48.0 49.6  Platelets 150 - 400 K/uL 185 170 182   . CMP Latest Ref Rng & Units 05/02/2019 11/27/2018 09/18/2018  Glucose 70 - 99 mg/dL 101(H) 201(H) 177(H)  BUN 8 - 23 mg/dL 20 17 26(H)  Creatinine 0.61 - 1.24 mg/dL 1.18 1.11 0.94  Sodium 135 - 145 mmol/L 141 141 139  Potassium 3.5 - 5.1 mmol/L 3.9 4.1 4.2  Chloride 98 - 111 mmol/L 106 109 108  CO2 22 - 32 mmol/L 26 25 19(L)  Calcium 8.9 - 10.3 mg/dL 9.1 9.8 8.6(L)  Total Protein 6.5 - 8.1 g/dL 7.2 7.0 6.6  Total Bilirubin 0.3 - 1.2 mg/dL 1.0 0.7 0.7  Alkaline Phos 38 - 126 U/L 69 82 72  AST 15 - 41 U/L 38 98(H) 59(H)  ALT 0 - 44 U/L 66(H) 147(H) 97(H)   . Lab Results  Component Value Date    IRON 95 10/31/2017   TIBC 343 10/31/2017   IRONPCTSAT 28 (L) 10/31/2017   (Iron and TIBC)  Lab Results  Component Value Date   FERRITIN 166 10/30/2019           RADIOGRAPHIC STUDIES: I have personally reviewed the radiological images as listed and agreed with the findings in the report. No results found.  ASSESSMENT & PLAN:   65 y.o. 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 Patient's ferritin at baseline appears to be around 700's. It was down to the 46 after serial therapeutic phlebotomies and now is back up to 129 As noted about he was a blood donor for 15 years prior to 1997 which was probably somewhat protective. Most patients with isolated H63D carrier state did not develop overt clinical presentations of hemochromatosis. Patient does not report any overt joint issues. Has had no cardiac issues or cardiac arrhythmias. Has had elevated liver function tests attributable both to steatohepatitis.  #2 Elevated liver function tests Based on liver biopsy is predominantly from steatohepatitis with stage I fibrosis. No overt evidence of iron overload. Patient has a liver specialist that he is seen by Roosevelt Locks NP) LFTs stable.  PLAN: -Discussed pt labwork today, 04/30/2020; Ferritin stable, blood counts normal, blood chemistries stable. Liver enzymes improved since last years.Tumor marker pending. -Continue phlebotomies q27month. -Discussed pt's enlarged prostate. Not a concern at this time as it is benign. -[facility] -Will f/u regarding tumor marker pending. -Will see the pt back in 12 months with labs.    #3 hypertension, dyslipidemia, hypothyroidism, sleep apnea, GERD -Continue management as per primary care physician   FOLLOW UP: Please schedule for next 3 therapeutic phlebotomies every 6 months with labs MD visit in 12 months   The total time spent in the  appointment was 20 minutes and more than 50% was on counseling and direct patient cares.   All of the patient's questions were answered with apparent satisfaction. The patient knows to call the clinic with any problems, questions or concerns.    GSullivan LoneMD MRuddAAHIVMS SCyp[facility] IncCMerit Health RankinHematology/Oncology Physician [facility] (Office):       [phone](Work cell):  [phone](Fax):  [phone]  04/29/2020 12:39 PM   I, Reinaldo Raddle, am acting as scribe for Dr. Sullivan Lone, MD.    .I have reviewed the above documentation for accuracy and completeness, and I agree with the above. Brunetta Genera MD

## 2020-04-30 ENCOUNTER — Inpatient Hospital Stay: Payer: Commercial Managed Care - PPO | Attending: Hematology

## 2020-04-30 ENCOUNTER — Other Ambulatory Visit: Payer: Self-pay

## 2020-04-30 ENCOUNTER — Inpatient Hospital Stay: Payer: Commercial Managed Care - PPO

## 2020-04-30 ENCOUNTER — Inpatient Hospital Stay: Payer: Commercial Managed Care - PPO | Admitting: Hematology

## 2020-04-30 DIAGNOSIS — R945 Abnormal results of liver function studies: Secondary | ICD-10-CM

## 2020-04-30 LAB — CMP (CANCER CENTER ONLY)
ALT: 66 U/L — ABNORMAL HIGH (ref 0–44)
AST: 40 U/L (ref 15–41)
Albumin: 4.1 g/dL (ref 3.5–5.0)
Alkaline Phosphatase: 65 U/L (ref 38–126)
Anion gap: 7 (ref 5–15)
BUN: 18 mg/dL (ref 8–23)
CO2: 25 mmol/L (ref 22–32)
Calcium: 10 mg/dL (ref 8.9–10.3)
Chloride: 106 mmol/L (ref 98–111)
Creatinine: 1.36 mg/dL — ABNORMAL HIGH (ref 0.61–1.24)
GFR, Estimated: 57 mL/min — ABNORMAL LOW (ref >60)
Glucose, Bld: 103 mg/dL — ABNORMAL HIGH (ref 70–99)
Potassium: 4.1 mmol/L (ref 3.5–5.1)
Sodium: 138 mmol/L (ref 135–145)
Total Bilirubin: 1 mg/dL (ref 0.3–1.2)
Total Protein: 7.4 g/dL (ref 6.5–8.1)

## 2020-04-30 LAB — CBC WITH DIFFERENTIAL/PLATELET
Abs Immature Granulocytes: 0.02 10*3/uL (ref 0.00–0.07)
Basophils Absolute: 0.1 10*3/uL (ref 0.0–0.1)
Basophils Relative: 1 %
Eosinophils Absolute: 0.2 10*3/uL (ref 0.0–0.5)
Eosinophils Relative: 3 %
HCT: 49.1 % (ref 39.0–52.0)
Hemoglobin: 17.1 g/dL — ABNORMAL HIGH (ref 13.0–17.0)
Immature Granulocytes: 0 %
Lymphocytes Relative: 40 %
Lymphs Abs: 2.3 10*3/uL (ref 0.7–4.0)
MCH: 30.4 pg (ref 26.0–34.0)
MCHC: 34.8 g/dL (ref 30.0–36.0)
MCV: 87.4 fL (ref 80.0–100.0)
Monocytes Absolute: 0.7 10*3/uL (ref 0.1–1.0)
Monocytes Relative: 12 %
Neutro Abs: 2.5 10*3/uL (ref 1.7–7.7)
Neutrophils Relative %: 44 %
Platelets: 161 10*3/uL (ref 150–400)
RBC: 5.62 MIL/uL (ref 4.22–5.81)
RDW: 13.6 % (ref 11.5–15.5)
WBC: 5.8 10*3/uL (ref 4.0–10.5)
nRBC: 0 % (ref 0.0–0.2)

## 2020-04-30 LAB — FERRITIN: Ferritin: 121 ng/mL (ref 24–336)

## 2020-04-30 NOTE — Patient Instructions (Signed)

## 2020-04-30 NOTE — Progress Notes (Signed)
Pt phlebotomy completed with 18g phlebotomy kit. Started at 1325, ended at 1232. 513g removed. Pt tolerated well. Snack and beverage provided.  

## 2020-05-01 LAB — AFP TUMOR MARKER: AFP, Serum, Tumor Marker: 5.5 ng/mL (ref 0.0–8.3)

## 2020-05-03 ENCOUNTER — Telehealth: Payer: Self-pay | Admitting: Hematology

## 2020-05-03 NOTE — Telephone Encounter (Signed)
Scheduled per 02/25 los, patient has been called and notified of upcoming appointments.

## 2020-05-07 ENCOUNTER — Telehealth: Payer: Self-pay | Admitting: *Deleted

## 2020-05-07 NOTE — Telephone Encounter (Signed)
Contacted patient regarding test results per Dr. Irene Limbo:  Please let patient know his AFP tumor marker was within normal limits. Patient verbalized understanding of information.

## 2020-07-26 ENCOUNTER — Encounter: Payer: Self-pay | Admitting: Hematology

## 2020-09-27 ENCOUNTER — Other Ambulatory Visit: Payer: Self-pay | Admitting: Nurse Practitioner

## 2020-09-27 DIAGNOSIS — K7581 Nonalcoholic steatohepatitis (NASH): Secondary | ICD-10-CM

## 2020-09-27 DIAGNOSIS — K7401 Hepatic fibrosis, early fibrosis: Secondary | ICD-10-CM

## 2020-10-20 ENCOUNTER — Ambulatory Visit
Admission: RE | Admit: 2020-10-20 | Discharge: 2020-10-20 | Disposition: A | Discharge status: Home / Self Care | Payer: Commercial Managed Care - PPO | Source: Ambulatory Visit | Attending: Nurse Practitioner | Admitting: Nurse Practitioner

## 2020-10-20 DIAGNOSIS — K7401 Hepatic fibrosis, early fibrosis: Secondary | ICD-10-CM

## 2020-10-20 DIAGNOSIS — K7581 Nonalcoholic steatohepatitis (NASH): Secondary | ICD-10-CM

## 2020-10-28 ENCOUNTER — Inpatient Hospital Stay: Payer: Commercial Managed Care - PPO | Attending: Hematology

## 2020-10-28 ENCOUNTER — Other Ambulatory Visit: Payer: Self-pay | Admitting: *Deleted

## 2020-10-28 ENCOUNTER — Other Ambulatory Visit: Payer: Self-pay

## 2020-10-28 ENCOUNTER — Inpatient Hospital Stay: Payer: Commercial Managed Care - PPO

## 2020-10-28 DIAGNOSIS — R945 Abnormal results of liver function studies: Secondary | ICD-10-CM

## 2020-10-28 LAB — FERRITIN: Ferritin: 146 ng/mL (ref 24–336)

## 2020-10-28 LAB — CBC WITH DIFFERENTIAL (CANCER CENTER ONLY)
Abs Immature Granulocytes: 0.02 10*3/uL (ref 0.00–0.07)
Basophils Absolute: 0.1 10*3/uL (ref 0.0–0.1)
Basophils Relative: 1 %
Eosinophils Absolute: 0.2 10*3/uL (ref 0.0–0.5)
Eosinophils Relative: 3 %
HCT: 45.7 % (ref 39.0–52.0)
Hemoglobin: 16.6 g/dL (ref 13.0–17.0)
Immature Granulocytes: 0 %
Lymphocytes Relative: 38 %
Lymphs Abs: 2.2 10*3/uL (ref 0.7–4.0)
MCH: 31 pg (ref 26.0–34.0)
MCHC: 36.3 g/dL — ABNORMAL HIGH (ref 30.0–36.0)
MCV: 85.4 fL (ref 80.0–100.0)
Monocytes Absolute: 0.7 10*3/uL (ref 0.1–1.0)
Monocytes Relative: 12 %
Neutro Abs: 2.7 10*3/uL (ref 1.7–7.7)
Neutrophils Relative %: 46 %
Platelet Count: 165 10*3/uL (ref 150–400)
RBC: 5.35 MIL/uL (ref 4.22–5.81)
RDW: 13.8 % (ref 11.5–15.5)
WBC Count: 5.9 10*3/uL (ref 4.0–10.5)
nRBC: 0 % (ref 0.0–0.2)

## 2020-10-28 LAB — CMP (CANCER CENTER ONLY)
ALT: 72 U/L — ABNORMAL HIGH (ref 0–44)
AST: 48 U/L — ABNORMAL HIGH (ref 15–41)
Albumin: 4 g/dL (ref 3.5–5.0)
Alkaline Phosphatase: 74 U/L (ref 38–126)
Anion gap: 8 (ref 5–15)
BUN: 18 mg/dL (ref 8–23)
CO2: 21 mmol/L — ABNORMAL LOW (ref 22–32)
Calcium: 9.4 mg/dL (ref 8.9–10.3)
Chloride: 109 mmol/L (ref 98–111)
Creatinine: 1.07 mg/dL (ref 0.61–1.24)
GFR, Estimated: >60 mL/min (ref >60)
Glucose, Bld: 117 mg/dL — ABNORMAL HIGH (ref 70–99)
Potassium: 3.7 mmol/L (ref 3.5–5.1)
Sodium: 138 mmol/L (ref 135–145)
Total Bilirubin: 0.6 mg/dL (ref 0.3–1.2)
Total Protein: 7 g/dL (ref 6.5–8.1)

## 2020-10-28 NOTE — Patient Instructions (Signed)
Therapeutic Phlebotomy, Care After This sheet gives you information about how to care for yourself after your procedure. Your health care provider may also give you more specific instructions. If you have problems or questions, contact your health careprovider. What can I expect after the procedure? After the procedure, it is common to have: Light-headedness or dizziness. You may feel faint. Nausea. Tiredness (fatigue). Follow these instructions at home: Eating and drinking Be sure to eat well-balanced meals for the next 24 hours. Drink enough fluid to keep your urine pale yellow. Avoid drinking alcohol on the day that you had the procedure. Activity  Return to your normal activities as told by your health care provider. Most people can go back to their normal activities right away. Avoid activities that take a lot of effort for about 5 hours after the procedure. Athletes should avoid strenuous exercise for at least 12 hours. Avoid heavy lifting or pulling for about 5 hours after the procedure. Do not lift anything that is heavier than 10 lb (4.5 kg). Change positions slowly for the remainder of the day. This will help to prevent light-headedness or fainting. If you feel light-headed, lie down until the feeling goes away.  Needle insertion site care  Keep your bandage (dressing) dry. You can remove the bandage after about 5 hours or as told by your health care provider. If you have bleeding from the needle insertion site, raise (elevate) your arm and press firmly on the site until the bleeding stops. If you have bruising at the site, apply ice to the area: Remove the dressing. Put ice in a plastic bag. Place a towel between your skin and the bag. Leave the ice on for 20 minutes, 2-3 times a day for the first 24 hours. If the swelling does not go away after 24 hours, apply a warm, moist cloth (warm compress) to the area for 20 minutes, 2-3 times a day.  General instructions Do not use  any products that contain nicotine or tobacco, such as cigarettes and e-cigarettes, for at least 30 minutes after the procedure. Keep all follow-up visits as told by your health care provider. This is important. You may need to continue having regular therapeutic phlebotomy treatments as directed. Contact a health care provider if you: Have redness, swelling, or pain at the needle insertion site. Have fluid or blood coming from the needle insertion site. Have pus or a bad smell coming from the needle insertion site. Notice that the needle insertion site feels warm to the touch. Feel light-headed, dizzy, or nauseous, and the feeling does not go away. Have new bruising at the needle insertion site. Feel weaker than normal. Have a fever or chills. Get help right away if: You faint. You have chest pain. You have trouble breathing. You have severe nausea or vomiting. Summary After the procedure, it is common to have some light-headedness, dizziness, nausea, or tiredness (fatigue). Be sure to eat well-balanced meals for the next 24 hours. Drink enough fluid to keep your urine pale yellow. Return to your normal activities as told by your health care provider. Keep all follow-up visits as told by your health care provider. You may need to continue having regular therapeutic phlebotomy treatments as directed. This information is not intended to replace advice given to you by your health care provider. Make sure you discuss any questions you have with your healthcare provider. Document Revised: 03/09/2017 Document Reviewed: 03/08/2017 Elsevier Patient Education  [place].

## 2020-10-28 NOTE — Progress Notes (Signed)
Jukka-Petri E Lawrie presents today for phlebotomy per MD orders. 16 g phlebotomy kit to R antecubital Phlebotomy procedure started at 1307 and ended at 1311. 509 grams removed.  Food & drink given. Patient observed for 30 minutes after procedure without any incident. Patient tolerated procedure well. IV needle removed intact.

## 2020-12-25 IMAGING — US US ABDOMEN LIMITED
1 series · 14 of 25 positions shown · non-contrast
Comparison: None.

CLINICAL DATA: NASH

EXAM:
ULTRASOUND ABDOMEN LIMITED RIGHT UPPER QUADRANT

[Series 1: us abdomen limited · 0.28mm/px · 14 of 31 slices shown]
[im 1/31]
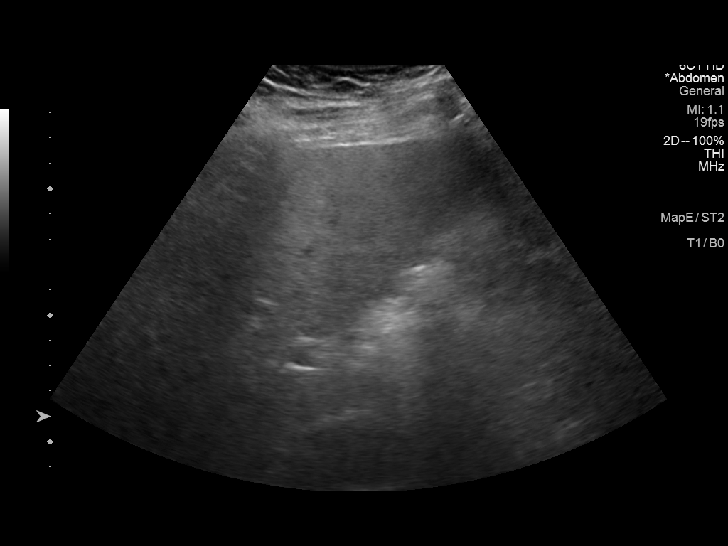
[im 3/31]
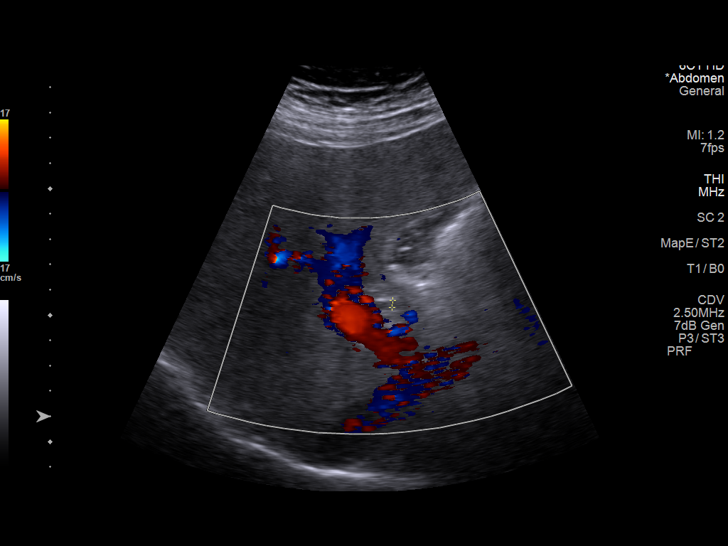
[im 6/31]
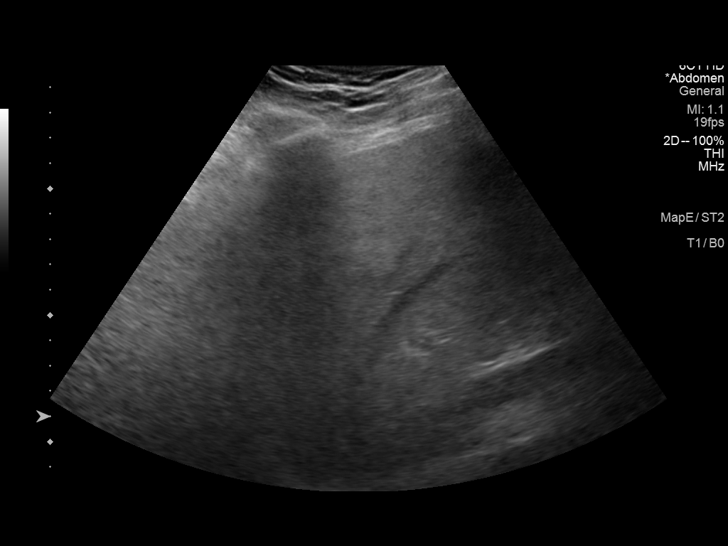
[im 8/31]
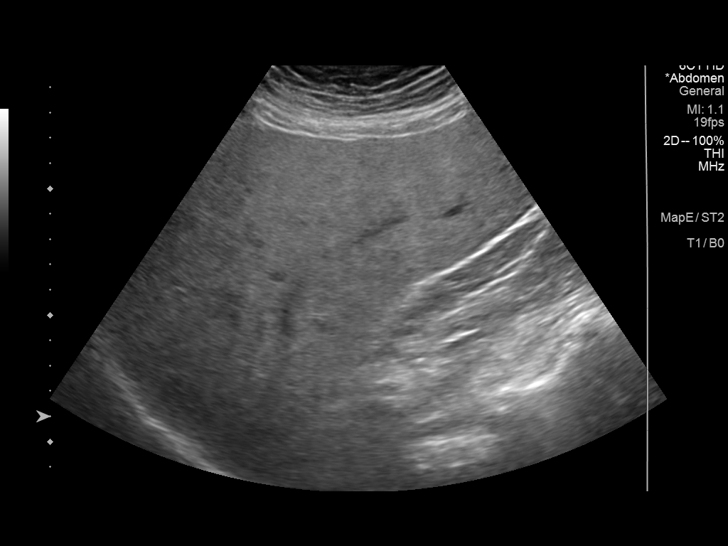
[im 11/31]
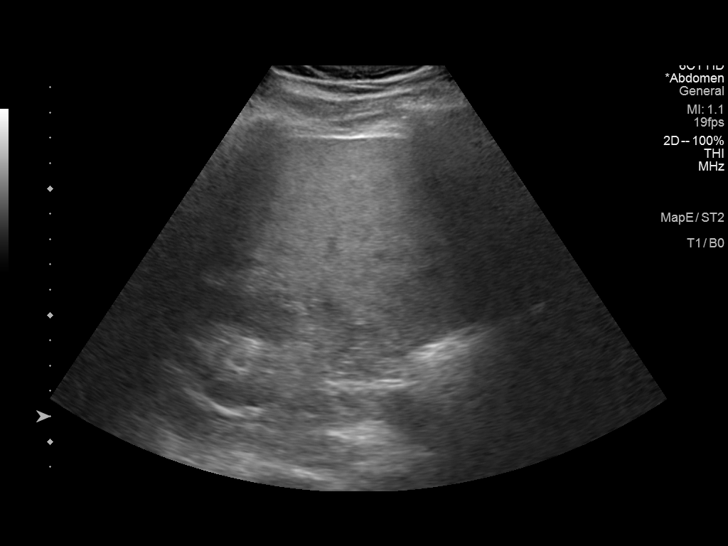
[im 12/31]
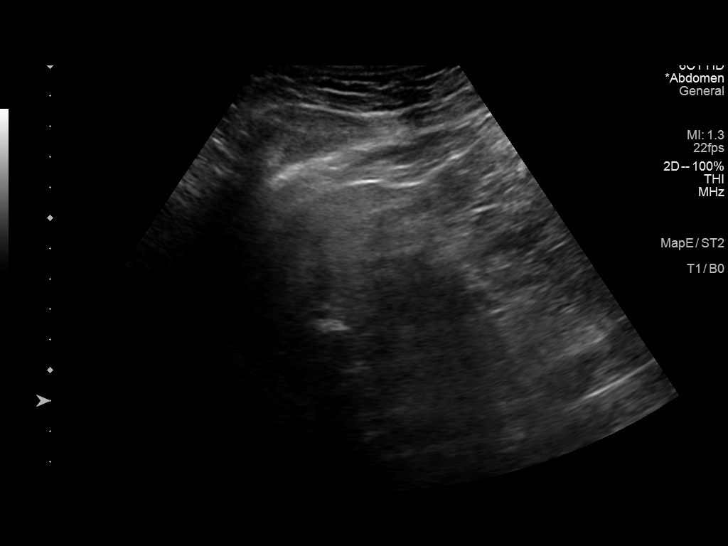
[im 14/31]
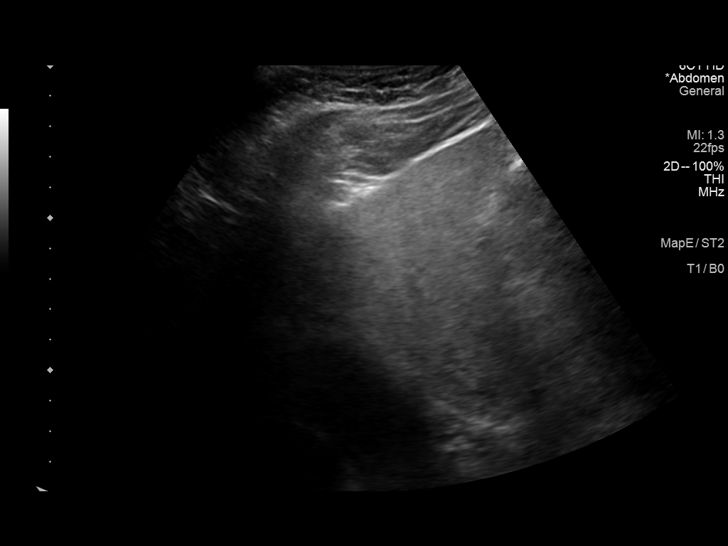
[im 17/31]
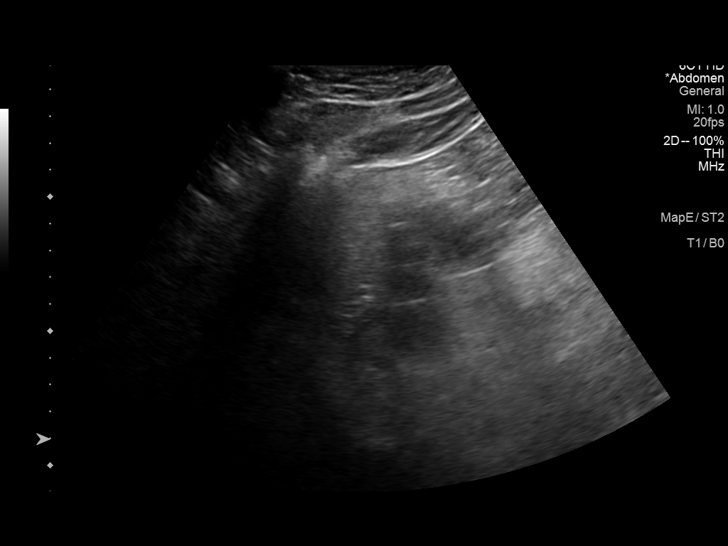
[im 19/31]
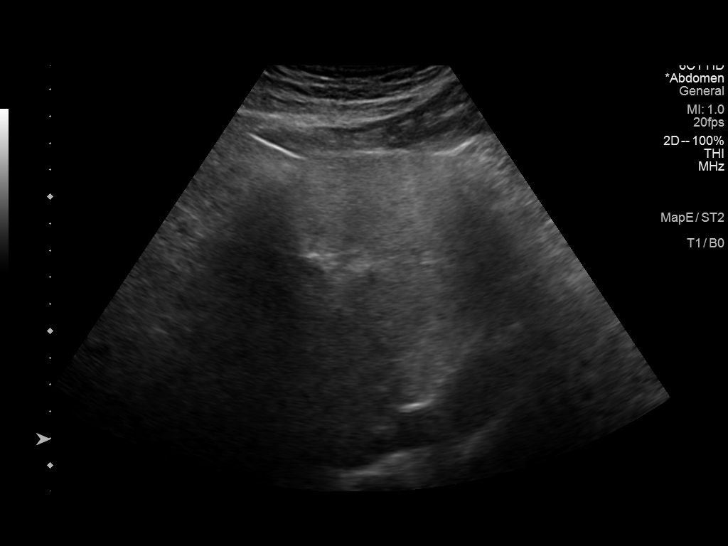
[im 21/31]
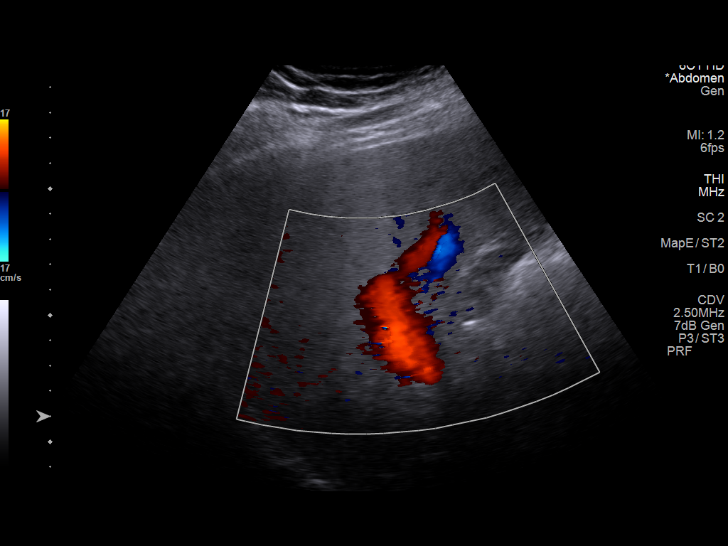
[im 23/31]
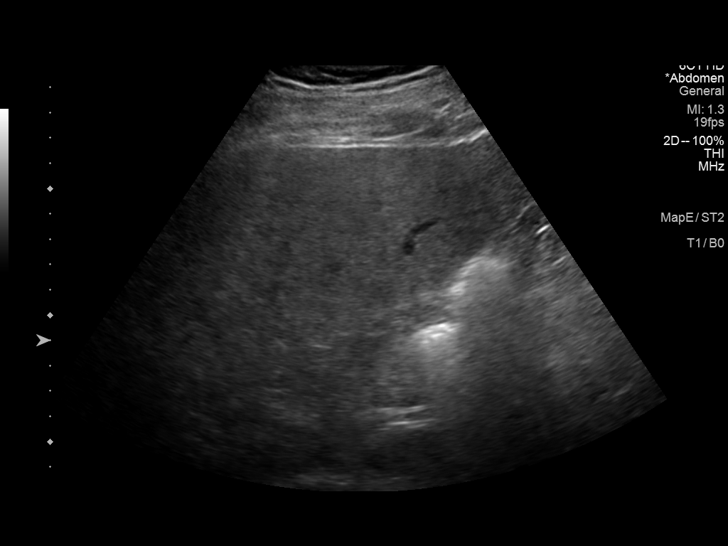
[im 26/31]
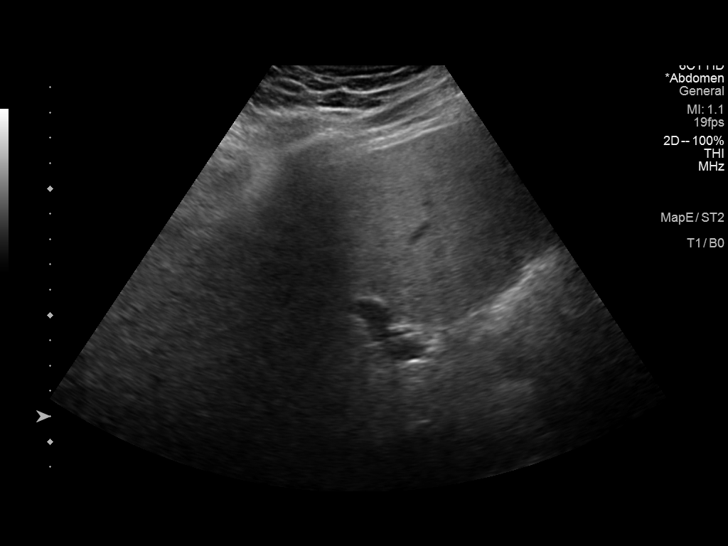
[im 28/31]
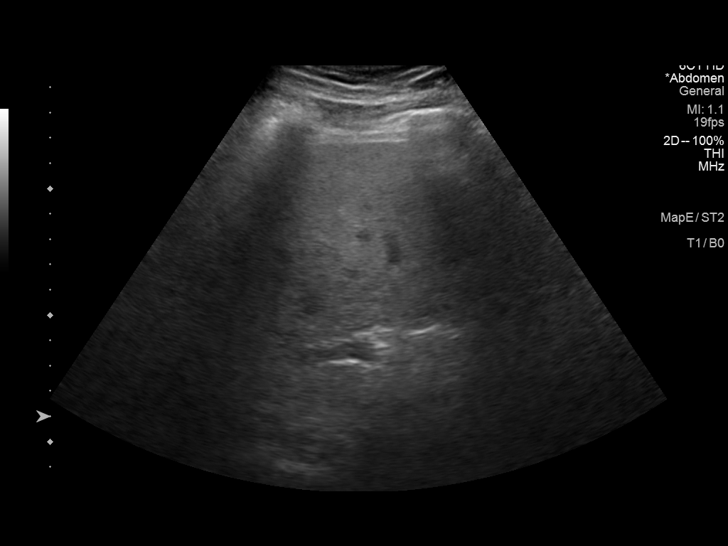
[im 31/31]
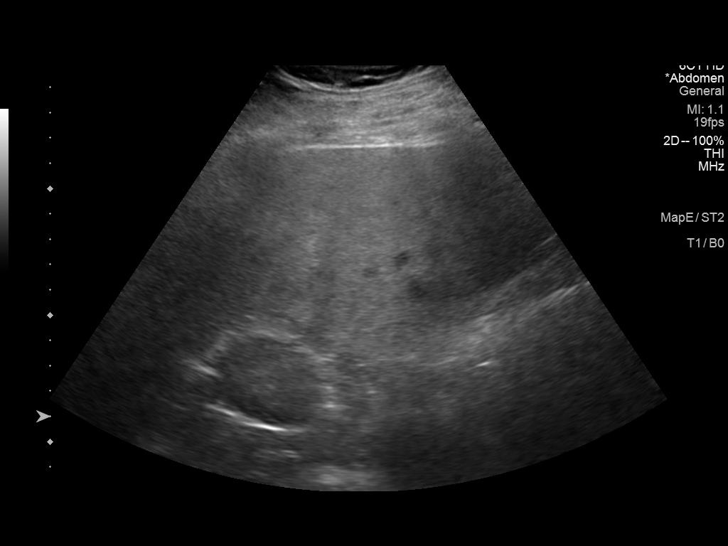

[14 of 25 positions shown; findings below may reference images not displayed]

FINDINGS: Gallbladder:

Cholecystectomy

Common bile duct:

Diameter: Not well evaluated.  3 mm, normal

Liver:

No focal lesion identified. Diffusely increased echogenicity and
poor increased acoustic penetration. Portal vein is patent on color
Doppler imaging with normal direction of blood flow towards the
liver. No surface nodularity identified.

Other: None.
IMPRESSION: Diffusely increased liver echogenicity reflecting significant
steatosis.

## 2021-04-28 ENCOUNTER — Other Ambulatory Visit: Payer: Self-pay

## 2021-04-29 ENCOUNTER — Inpatient Hospital Stay: Payer: Commercial Managed Care - PPO | Attending: Hematology

## 2021-04-29 ENCOUNTER — Inpatient Hospital Stay: Payer: Commercial Managed Care - PPO

## 2021-04-29 ENCOUNTER — Inpatient Hospital Stay (HOSPITAL_BASED_OUTPATIENT_CLINIC_OR_DEPARTMENT_OTHER): Payer: Commercial Managed Care - PPO | Admitting: Hematology

## 2021-04-29 ENCOUNTER — Other Ambulatory Visit: Payer: Self-pay

## 2021-04-29 DIAGNOSIS — R945 Abnormal results of liver function studies: Secondary | ICD-10-CM

## 2021-04-29 LAB — CBC WITH DIFFERENTIAL (CANCER CENTER ONLY)
Abs Immature Granulocytes: 0.02 10*3/uL (ref 0.00–0.07)
Basophils Absolute: 0.1 10*3/uL (ref 0.0–0.1)
Basophils Relative: 1 %
Eosinophils Absolute: 0.2 10*3/uL (ref 0.0–0.5)
Eosinophils Relative: 3 %
HCT: 47.3 % (ref 39.0–52.0)
Hemoglobin: 17.2 g/dL — ABNORMAL HIGH (ref 13.0–17.0)
Immature Granulocytes: 0 %
Lymphocytes Relative: 39 %
Lymphs Abs: 2.3 10*3/uL (ref 0.7–4.0)
MCH: 30.9 pg (ref 26.0–34.0)
MCHC: 36.4 g/dL — ABNORMAL HIGH (ref 30.0–36.0)
MCV: 85.1 fL (ref 80.0–100.0)
Monocytes Absolute: 0.7 10*3/uL (ref 0.1–1.0)
Monocytes Relative: 13 %
Neutro Abs: 2.5 10*3/uL (ref 1.7–7.7)
Neutrophils Relative %: 44 %
Platelet Count: 155 10*3/uL (ref 150–400)
RBC: 5.56 MIL/uL (ref 4.22–5.81)
RDW: 13.1 % (ref 11.5–15.5)
WBC Count: 5.8 10*3/uL (ref 4.0–10.5)
nRBC: 0 % (ref 0.0–0.2)

## 2021-04-29 LAB — CMP (CANCER CENTER ONLY)
ALT: 55 U/L — ABNORMAL HIGH (ref 0–44)
AST: 36 U/L (ref 15–41)
Albumin: 4.2 g/dL (ref 3.5–5.0)
Alkaline Phosphatase: 64 U/L (ref 38–126)
Anion gap: 8 (ref 5–15)
BUN: 18 mg/dL (ref 8–23)
CO2: 24 mmol/L (ref 22–32)
Calcium: 9.6 mg/dL (ref 8.9–10.3)
Chloride: 105 mmol/L (ref 98–111)
Creatinine: 1.13 mg/dL (ref 0.61–1.24)
GFR, Estimated: >60 mL/min (ref >60)
Glucose, Bld: 129 mg/dL — ABNORMAL HIGH (ref 70–99)
Potassium: 3.6 mmol/L (ref 3.5–5.1)
Sodium: 137 mmol/L (ref 135–145)
Total Bilirubin: 0.9 mg/dL (ref 0.3–1.2)
Total Protein: 7.1 g/dL (ref 6.5–8.1)

## 2021-04-29 LAB — IRON AND IRON BINDING CAPACITY (CC-WL,HP ONLY)
Iron: 128 ug/dL (ref 45–182)
Saturation Ratios: 35 % (ref 17.9–39.5)
TIBC: 371 ug/dL (ref 250–450)
UIBC: 243 ug/dL (ref 117–376)

## 2021-04-29 LAB — FERRITIN: Ferritin: 120 ng/mL (ref 24–336)

## 2021-04-29 NOTE — Patient Instructions (Signed)

## 2021-04-29 NOTE — Progress Notes (Signed)
Bob Burton    HEMATOLOGY/ONCOLOGY CLINIC NOTE  Date of Service: 04/29/21  PCP: Dr Aura Dials MD   [place]:  For continued evaluation and management of hereditary hemochromatosis we see previous note for details on initial presentation   HISTORY OF PRESENTING ILLNESS:   Please see previous note for details on initial presentation  INTERVAL HISTORY  Bob Burton is here for follow-up of his hereditary hemochromatosis. He notes no acute new symptoms since his last clinic visit.  He has been tolerating his every 64-monththerapeutic phlebotomies without any issues. 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 Now uses an oral device. 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  #8 elevated liver function tests  #9 history of gout #10 gastroesophageal reflux  SURGICAL HISTORY:  #1 right wrist surgery #2 reports he is scheduled for carpal tunnel syndrome surgery #3 Lasik 2007  SOCIAL HISTORY: Social History   Socioeconomic History   Marital status: Married    Spouse name: Not on file   Number of children: Not on file   Years of education: Not on file   Highest education level: Not on file  Occupational History   Not on file  Tobacco Use   Smoking status: Never   Smokeless tobacco: Never  Vaping Use   Vaping Use: Never used  Substance and Sexual Activity   Alcohol use: No   Drug use: No   Sexual activity: Not on file  Other Topics Concern   Not on file  Social History Narrative   Not on file   Social Determinants of Health   Financial Resource Strain: Not on file  Food Insecurity: Not on file  Transportation Needs: Not on file  Physical Activity: Not on file   Stress: Not on file  Social Connections: Not on file  Intimate Partner Violence: Not on file  Patient notes he never smoked with no significant history of exposure to secondhand smoke Notes that he does not consume alcohol No recreational drug use Married to his wife Bob Burton 2 sons and 1 daughter 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  FAMILY HISTORY: Patient is originally from [place] Father deceased-from suicide Mother 824year old  ALLERGIES:  has No Known Allergies.  MEDICATIONS:  Current Outpatient Medications  Medication Sig Dispense Refill   acetaminophen (TYLENOL) 325 MG tablet Take 2 tablets (650 mg total) by mouth every 6 (six) hours as needed for fever (fever >/= 101). OVER THE COUNTER 30 tablet 0   albuterol (VENTOLIN HFA) 108 (90 Base) MCG/ACT inhaler Inhale 2 puffs into the lungs every 6 (six) hours as needed for wheezing or shortness of breath. 16 g 3   chlorpheniramine-HYDROcodone (TUSSIONEX) 10-8 MG/5ML SUER Take 5 mLs by mouth every 12 (twelve) hours as needed for cough. 115 mL 0   cholecalciferol (VITAMIN D) 25 MCG tablet Take 1 tablet (1,000 Units total) by mouth daily. 30 tablet 3   CYANOCOBALAMIN PO Take 1 tablet by mouth daily.     levothyroxine (SYNTHROID, LEVOTHROID) 150 MCG tablet Take 150 mcg by mouth daily before breakfast.      lisinopril-hydrochlorothiazide (PRINZIDE,ZESTORETIC) 10-12.5 MG tablet Take 1 tablet by mouth every morning.   0   Multiple Vitamin (  MULTIVITAMIN WITH MINERALS) TABS tablet Take 1 tablet by mouth daily. 30 tablet 3   predniSONE (DELTASONE) 10 MG tablet Prednisone dosing: Take  Prednisone 39m (4 tabs) x 3 days, then taper to 337m(3 tabs) x 3 days, then 2056m2 tabs) x 3days, then 24m53m tab) x 3days, then OFF. 30 tablet 0   vitamin C (VITAMIN C) 1000 MG tablet Take 1 tablet (1,000 mg total) by mouth daily. 30 tablet 3   zinc sulfate 220 (50 Zn) MG  capsule Take 1 capsule (220 mg total) by mouth daily. 30 capsule 3   No current facility-administered medications for this visit.    REVIEW OF SYSTEMS:   10 Point review of Systems was done is negative except as noted above.  PHYSICAL EXAMINATION: ECOG PERFORMANCE STATUS: 0 - Asymptomatic  Vitals:   04/29/21 0850  BP: 118/84  Pulse: 78  Resp: 20  Temp: 97.7 F (36.5 C)  SpO2: 97%   Filed Weights   04/29/21 0850  Weight: 282 lb 11.2 oz (128.2 kg)   .Body mass index is 39.43 kg/m.  NAD GENERAL:alert, in no acute distress and comfortable EYES: conjunctiva are pink and non-injected, sclera anicteric OROPHARYNX: MMM NECK: supple, no JVD LYMPH:  no palpable lymphadenopathy in the cervical, axillary or inguinal regions LUNGS: clear to auscultation b/l with normal respiratory effort HEART: regular rate & rhythm ABDOMEN:  normoactive bowel sounds , non tender, not distended. Extremity: no pedal edema PSYCH: alert & oriented x 3 with fluent speech   LABORATORY DATA:  I have reviewed the data as listed  . CBC Latest Ref Rng & Units 04/29/2021 10/28/2020 04/30/2020  WBC 4.0 - 10.5 K/uL 5.8 5.9 5.8  Hemoglobin 13.0 - 17.0 g/dL 17.2(H) 16.6 17.1(H)  Hematocrit 39.0 - 52.0 % 47.3 45.7 49.1  Platelets 150 - 400 K/uL 155 165 161   . CMP Latest Ref Rng & Units 04/29/2021 10/28/2020 04/30/2020  Glucose 70 - 99 mg/dL 129(H) 117(H) 103(H)  BUN 8 - 23 mg/dL 18 18 18   Creatinine 0.61 - 1.24 mg/dL 1.13 1.07 1.36(H)  Sodium 135 - 145 mmol/L 137 138 138  Potassium 3.5 - 5.1 mmol/L 3.6 3.7 4.1  Chloride 98 - 111 mmol/L 105 109 106  CO2 22 - 32 mmol/L 24 21(L) 25  Calcium 8.9 - 10.3 mg/dL 9.6 9.4 10.0  Total Protein 6.5 - 8.1 g/dL 7.1 7.0 7.4  Total Bilirubin 0.3 - 1.2 mg/dL 0.9 0.6 1.0  Alkaline Phos 38 - 126 U/L 64 74 65  AST 15 - 41 U/L 36 48(H) 40  ALT 0 - 44 U/L 55(H) 72(H) 66(H)   . Lab Results  Component Value Date   IRON 128 04/29/2021   TIBC 371 04/29/2021   IRONPCTSAT 35  04/29/2021   (Iron and TIBC)  Lab Results  Component Value Date   FERRITIN 120 04/29/2021           RADIOGRAPHIC STUDIES: I have personally reviewed the radiological images as listed and agreed with the findings in the report. No results found.  ASSESSMENT & PLAN:   65 y55. 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 Patient's ferritin at baseline appears to be around 700's. 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 Most patients with isolated H63D carrier state did not develop overt clinical presentations of hemochromatosis. Patient does not report any overt joint issues. Has had  no cardiac issues or cardiac arrhythmias. Has had elevated liver function tests attributable both to steatohepatitis.  #2 Elevated liver function tests Based on liver biopsy is predominantly from steatohepatitis with stage I fibrosis. No overt evidence of iron overload. Patient has a liver specialist that he is seen by Roosevelt Locks NP)   PLAN: -Discussed patient's labs from today CBC within normal limits CMP unremarkable except ALT of 55 which is improved Ferritin 120 with iron saturation of 35% -Ferritin goal is to keep the ferritin close to 100 and definitely less than 200.  And iron saturation less than 60%. -His chronic liver disease is primarily from fatty liver and he was recommended to continue pursuing healthy lifestyle and weight reduction to reduce risk of progression to NASH related liver cirrhosis. -Continue follow-up with [facility] -We discussed that he could inquire if any community blood bank's would be willing to accept blood donation.  If he has he could donate blood to 3 times a year and could save on visits to the cancer center for therapeutic phlebotomies.  FOLLOW UP: Please schedule for next 3 therapeutic phlebotomies every 6 months with labs MD visit in 12  months   Sullivan Lone MD [place]

## 2021-04-29 NOTE — Progress Notes (Signed)
Bob Burton presents today for phlebotomy per MD orders. 16G phlebotomy kit was used to the RAC.  Phlebotomy procedure started at 0933 and ended at 0945. 395 grams removed, Pt clotted off, and refused second stick.  Patient observed for 15 minutes after procedure without any incident. Pt provided food and beverage.  Patient tolerated procedure well. IV needle removed intact. VSS at discharge.  Ambulatory to lobby.

## 2021-05-05 ENCOUNTER — Encounter: Payer: Self-pay | Admitting: Hematology

## 2021-10-21 ENCOUNTER — Encounter: Payer: Self-pay | Admitting: Hematology

## 2021-10-28 ENCOUNTER — Other Ambulatory Visit: Payer: Self-pay

## 2021-10-28 ENCOUNTER — Inpatient Hospital Stay: Payer: Commercial Managed Care - PPO | Attending: Hematology

## 2021-10-28 ENCOUNTER — Inpatient Hospital Stay: Payer: Commercial Managed Care - PPO

## 2021-10-28 DIAGNOSIS — R945 Abnormal results of liver function studies: Secondary | ICD-10-CM

## 2021-10-28 LAB — CBC WITH DIFFERENTIAL (CANCER CENTER ONLY)
Abs Immature Granulocytes: 0.03 10*3/uL (ref 0.00–0.07)
Basophils Absolute: 0.1 10*3/uL (ref 0.0–0.1)
Basophils Relative: 1 %
Eosinophils Absolute: 0.2 10*3/uL (ref 0.0–0.5)
Eosinophils Relative: 3 %
HCT: 46.1 % (ref 39.0–52.0)
Hemoglobin: 16.7 g/dL (ref 13.0–17.0)
Immature Granulocytes: 1 %
Lymphocytes Relative: 22 %
Lymphs Abs: 1.5 10*3/uL (ref 0.7–4.0)
MCH: 31.3 pg (ref 26.0–34.0)
MCHC: 36.2 g/dL — ABNORMAL HIGH (ref 30.0–36.0)
MCV: 86.5 fL (ref 80.0–100.0)
Monocytes Absolute: 0.7 10*3/uL (ref 0.1–1.0)
Monocytes Relative: 11 %
Neutro Abs: 4.2 10*3/uL (ref 1.7–7.7)
Neutrophils Relative %: 62 %
Platelet Count: 146 10*3/uL — ABNORMAL LOW (ref 150–400)
RBC: 5.33 MIL/uL (ref 4.22–5.81)
RDW: 13.4 % (ref 11.5–15.5)
WBC Count: 6.6 10*3/uL (ref 4.0–10.5)
nRBC: 0 % (ref 0.0–0.2)

## 2021-10-28 LAB — IRON AND IRON BINDING CAPACITY (CC-WL,HP ONLY)
Iron: 54 ug/dL (ref 45–182)
Saturation Ratios: 16 % — ABNORMAL LOW (ref 17.9–39.5)
TIBC: 342 ug/dL (ref 250–450)
UIBC: 288 ug/dL (ref 117–376)

## 2021-10-28 LAB — CMP (CANCER CENTER ONLY)
ALT: 43 U/L (ref 0–44)
AST: 27 U/L (ref 15–41)
Albumin: 4.3 g/dL (ref 3.5–5.0)
Alkaline Phosphatase: 83 U/L (ref 38–126)
Anion gap: 5 (ref 5–15)
BUN: 20 mg/dL (ref 8–23)
CO2: 28 mmol/L (ref 22–32)
Calcium: 9.8 mg/dL (ref 8.9–10.3)
Chloride: 106 mmol/L (ref 98–111)
Creatinine: 1.05 mg/dL (ref 0.61–1.24)
GFR, Estimated: >60 mL/min (ref >60)
Glucose, Bld: 185 mg/dL — ABNORMAL HIGH (ref 70–99)
Potassium: 3.8 mmol/L (ref 3.5–5.1)
Sodium: 139 mmol/L (ref 135–145)
Total Bilirubin: 0.8 mg/dL (ref 0.3–1.2)
Total Protein: 7.1 g/dL (ref 6.5–8.1)

## 2021-10-28 LAB — FERRITIN: Ferritin: 139 ng/mL (ref 24–336)

## 2021-10-28 NOTE — Progress Notes (Signed)
Per Dr. Irene Limbo OK to proceed w/ Phlebotomy w/ labs in parameters.

## 2021-10-28 NOTE — Patient Instructions (Signed)

## 2021-10-28 NOTE — Progress Notes (Signed)
Ariyan E Tillison presents today for phlebotomy per MD orders. 16G phlebotomy kit was used to the L upper FA. Pt clotted off after 10 minutes. A second stick was performed w/ a 20G PIV to the RAC.  Phlebotomy procedure started at 1149 and ended at 1219. 499 grams removed in total. Patient observed for 15 minutes after procedure without any incident. Patient provided food and beverage. Patient tolerated procedure well. IV needle removed intact. VSS at discharge.  Ambulatory to lobby without complaint.    

## 2021-11-18 ENCOUNTER — Other Ambulatory Visit: Payer: Self-pay | Admitting: Nurse Practitioner

## 2021-11-18 DIAGNOSIS — K7401 Hepatic fibrosis, early fibrosis: Secondary | ICD-10-CM

## 2021-11-18 DIAGNOSIS — K7581 Nonalcoholic steatohepatitis (NASH): Secondary | ICD-10-CM

## 2021-11-24 ENCOUNTER — Ambulatory Visit
Admission: RE | Admit: 2021-11-24 | Discharge: 2021-11-24 | Disposition: A | Discharge status: Home / Self Care | Payer: Commercial Managed Care - PPO | Source: Ambulatory Visit | Attending: Nurse Practitioner | Admitting: Nurse Practitioner

## 2021-11-24 DIAGNOSIS — K7581 Nonalcoholic steatohepatitis (NASH): Secondary | ICD-10-CM

## 2021-11-24 DIAGNOSIS — K7401 Hepatic fibrosis, early fibrosis: Secondary | ICD-10-CM

## 2021-11-27 ENCOUNTER — Emergency Department (HOSPITAL_BASED_OUTPATIENT_CLINIC_OR_DEPARTMENT_OTHER)
Admission: EM | Admit: 2021-11-27 | Discharge: 2021-11-27 | Disposition: A | Discharge status: Home / Self Care | Payer: Commercial Managed Care - PPO | Attending: Emergency Medicine | Admitting: Emergency Medicine

## 2021-11-27 ENCOUNTER — Encounter (HOSPITAL_BASED_OUTPATIENT_CLINIC_OR_DEPARTMENT_OTHER): Payer: Self-pay

## 2021-11-27 DIAGNOSIS — R300 Dysuria: Secondary | ICD-10-CM | POA: Insufficient documentation

## 2021-11-27 DIAGNOSIS — R3911 Hesitancy of micturition: Secondary | ICD-10-CM | POA: Diagnosis not present

## 2021-11-27 DIAGNOSIS — R3915 Urgency of urination: Secondary | ICD-10-CM | POA: Insufficient documentation

## 2021-11-27 DIAGNOSIS — Z79899 Other long term (current) drug therapy: Secondary | ICD-10-CM | POA: Diagnosis not present

## 2021-11-27 LAB — URINALYSIS, ROUTINE W REFLEX MICROSCOPIC
Bilirubin Urine: NEGATIVE
Glucose, UA: NEGATIVE mg/dL
Hgb urine dipstick: NEGATIVE
Ketones, ur: NEGATIVE mg/dL
Leukocytes,Ua: NEGATIVE
Nitrite: NEGATIVE
Protein, ur: NEGATIVE mg/dL
Specific Gravity, Urine: 1.008 (ref 1.005–1.030)
pH: 5.5 (ref 5.0–8.0)

## 2021-11-27 MED ORDER — LIDOCAINE HCL URETHRAL/MUCOSAL 2 % EX GEL
1.0000 | Freq: Once | CUTANEOUS | Status: AC
  Administered 2021-11-27: 1 via TOPICAL
  Filled 2021-11-27: qty 11

## 2021-11-27 NOTE — ED Triage Notes (Signed)
Pt states every time he goes to pee, he only gets a few drops out. Pt states that it is painful to void.

## 2021-11-27 NOTE — ED Notes (Signed)
Pt bladder scanned. Highest amount scanned 181 ml. Informed Butch Penny - RN.

## 2021-11-27 NOTE — Discharge Instructions (Signed)
Today you were seen in the emergency department for your painful urination and difficulty peeing.    In the emergency department you had a bladder scan and urine studies that were reassuring.    At home, please use the topical lidocaine as needed for any irritation that may have.    Check your MyChart online for the results of any tests that had not resulted by the time you left the emergency department.   Follow-up with your primary doctor in 2-3 days regarding your visit.  A urology referral has been placed and they will call you about an appointment.  You may also try to contact your urologist about setting up an appointment soon as possible.  Return immediately to the emergency department if you experience any of the following: Fevers, flank pain, vomiting, inability to urinate, or any other concerning symptoms.    Thank you for visiting our Emergency Department. It was a pleasure taking care of you today.

## 2021-11-27 NOTE — ED Provider Notes (Signed)
[facility] EMERGENCY DEPT Provider Note   CSN: [id number] Arrival date & time: 11/27/21  5361     History  Chief Complaint  Patient presents with   Dysuria    Bob Burton is a 66 y.o. male.  66 year old male with a history of hemochromatosis, NASH, and BPH who presents emergency department with dysuria and urinary urgency.  Patient states that yesterday at noon he started experiencing mild pain at the tip of his penis while urinating.  Says that he feels that he is unable to fully void even after he urinates.  Denies any fevers, flank pain, or hematuria.  Sexually active with his wife only and no concern for STIs at this time.       Home Medications Prior to Admission medications   Medication Sig Start Date End Date Taking? Authorizing Provider  acetaminophen (TYLENOL) 325 MG tablet Take 2 tablets (650 mg total) by mouth every 6 (six) hours as needed for fever (fever >/= 101). OVER THE COUNTER 09/18/18   Rai, Ripudeep K, MD  albuterol (VENTOLIN HFA) 108 (90 Base) MCG/ACT inhaler Inhale 2 puffs into the lungs every 6 (six) hours as needed for wheezing or shortness of breath. 09/18/18   Rai, Vernelle Emerald, MD  chlorpheniramine-HYDROcodone (TUSSIONEX) 10-8 MG/5ML SUER Take 5 mLs by mouth every 12 (twelve) hours as needed for cough. 09/18/18   Rai, Vernelle Emerald, MD  cholecalciferol (VITAMIN D) 25 MCG tablet Take 1 tablet (1,000 Units total) by mouth daily. 09/19/18   Rai, Ripudeep K, MD  CYANOCOBALAMIN PO Take 1 tablet by mouth daily.    [provider]  levothyroxine (SYNTHROID, LEVOTHROID) 150 MCG tablet Take 150 mcg by mouth daily before breakfast.     [provider]  lisinopril-hydrochlorothiazide (PRINZIDE,ZESTORETIC) 10-12.5 MG tablet Take 1 tablet by mouth every morning.  11/04/15   [provider]  Multiple Vitamin (MULTIVITAMIN WITH MINERALS) TABS tablet Take 1 tablet by mouth daily. 09/19/18   Rai, Vernelle Emerald, MD  predniSONE (DELTASONE)  10 MG tablet Prednisone dosing: Take  Prednisone 27m (4 tabs) x 3 days, then taper to 32m(3 tabs) x 3 days, then 2031m2 tabs) x 3days, then 12m15m tab) x 3days, then OFF. 09/18/18   Rai, Ripudeep K, MD  vitamin C (VITAMIN C) 1000 MG tablet Take 1 tablet (1,000 mg total) by mouth daily. 09/19/18   Rai, RipuVernelle Emerald  zinc sulfate 220 (50 Zn) MG capsule Take 1 capsule (220 mg total) by mouth daily. 09/19/18   Rai,Mendel Corning      Allergies    Patient has no known allergies.    Review of Systems   Review of Systems  Physical Exam Updated Vital Signs BP (!) 107/59 (BP Location: Left Arm)   Pulse 84   Temp 98.1 F (36.7 C) (Oral)   Resp 16   Ht 6' (1.829 m)   Wt 121.6 kg   SpO2 98%   BMI 36.35 kg/m  Physical Exam Vitals and nursing note reviewed.  Constitutional:      General: He is not in acute distress.    Appearance: He is well-developed.  HENT:     Head: Normocephalic and atraumatic.     Right Ear: External ear normal.     Left Ear: External ear normal.     Nose: Nose normal.  Eyes:     Extraocular Movements: Extraocular movements intact.     Conjunctiva/sclera: Conjunctivae normal.     Pupils: Pupils are  equal, round, and reactive to light.  Abdominal:     General: There is no distension.     Palpations: Abdomen is soft. There is no mass.     Tenderness: There is no abdominal tenderness. There is no right CVA tenderness, left CVA tenderness or guarding.  Genitourinary:    Comments: Uncircumcised penis. No rashes or lesions noted. No perineal TTP above prostate.  Musculoskeletal:        General: No swelling.     Cervical back: Normal range of motion and neck supple.     Right lower leg: No edema.     Left lower leg: No edema.  Skin:    General: Skin is warm and dry.     Capillary Refill: Capillary refill takes less than 2 seconds.  Neurological:     Mental Status: He is alert. Mental status is at baseline.  Psychiatric:        Mood and Affect: Mood normal.         Behavior: Behavior normal.     ED Results / Procedures / Treatments   Labs (all labs ordered are listed, but only abnormal results are displayed) Labs Reviewed  URINALYSIS, ROUTINE W REFLEX MICROSCOPIC    EKG None  Radiology No results found.  Procedures Procedures  EMERGENCY DEPARTMENT ULTRASOUND  Study: Limited Ultrasound of Bladder  INDICATIONS: to assess for urinary retention and/or bladder volume prior to urinary catheter Multiple views of the bladder were obtained in real-time in the transverse and longitudinal planes with a multi-frequency probe.  PERFORMED BY: Myself IMAGES ARCHIVED?: No LIMITATIONS:  none INTERPRETATION: Volume Measurement 258 mL - pre-void  Medications Ordered in ED Medications  lidocaine (XYLOCAINE) 2 % jelly 1 Application (1 Application Topical Given 11/27/21 1154)    ED Course/ Medical Decision Making/ A&P Clinical Course as of 11/28/21 1050  Sun Nov 27, 2021  1231 Post void residual 181 mL.  [RP]    Clinical Course User Index [RP] Fransico Meadow, MD                           Medical Decision Making Amount and/or Complexity of Data Reviewed Labs: ordered.   RUMALDO DIFATTA is a 66 y.o. male with history of BPH who presents with chief complaint of dysuria and frequency.  Initial Ddx:  UTI, urinary retention, prostatitis, interstitial cystitis, STI   MDM:  Initially given the patient's symptoms was concerned about possible UTI. Based on my [place] no significant urinary retention but will also obtain PVR. No pain in his perineum that would suggest prostatitis. No risk factors for STI. Could have interstitial cystitis if UA negative.   Plan:  UA PVR Considered lab work but not retaining fluids so feel that BMP is indicated to assess for AKI.   ED Summary:  Pt had PVR that did not show significant urinary retention. Also had UA that was not consistent with UTI or urethritis. Pt was given ambulatory referral to urology  for further evaluation.    Dispo: DC Home. Return precautions discussed including, but not limited to, those listed in the AVS. Allowed pt time to ask questions which were answered fully prior to dc.   Records reviewed Care Everywhere The following labs were independently interpreted: Urinalysis  Final Clinical Impression(s) / ED Diagnoses Final diagnoses:  Dysuria  Hesitancy    Rx / DC Orders ED Discharge Orders          Ordered  Ambulatory referral to Urology        11/27/21 1256              Fransico Meadow, MD 11/28/21 1050

## 2022-01-16 ENCOUNTER — Encounter: Payer: Self-pay | Admitting: Urology

## 2022-01-16 IMAGING — US US ABDOMEN LIMITED
1 series · 14 of 25 positions shown · non-contrast
Comparison: 09/29/2019

CLINICAL DATA: [place]

EXAM:
ULTRASOUND ABDOMEN LIMITED RIGHT UPPER QUADRANT

[Series 1: us abdomen limited · 0.31mm/px · 14 of 37 slices shown]
[im 1/37]
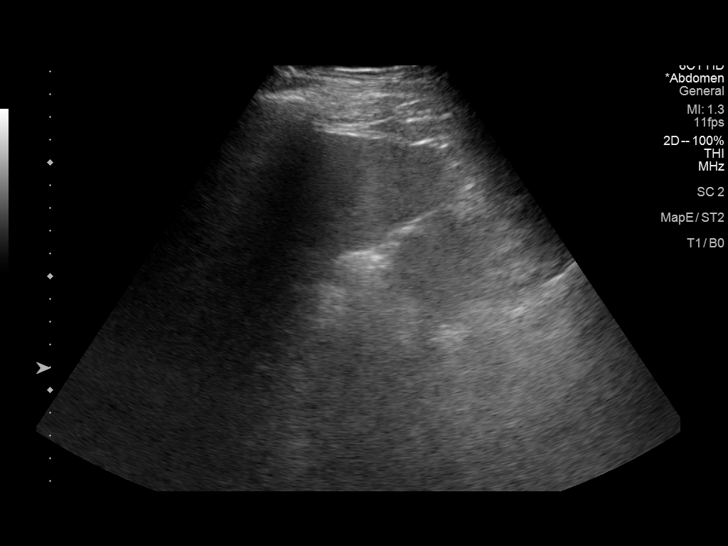
[im 4/37]
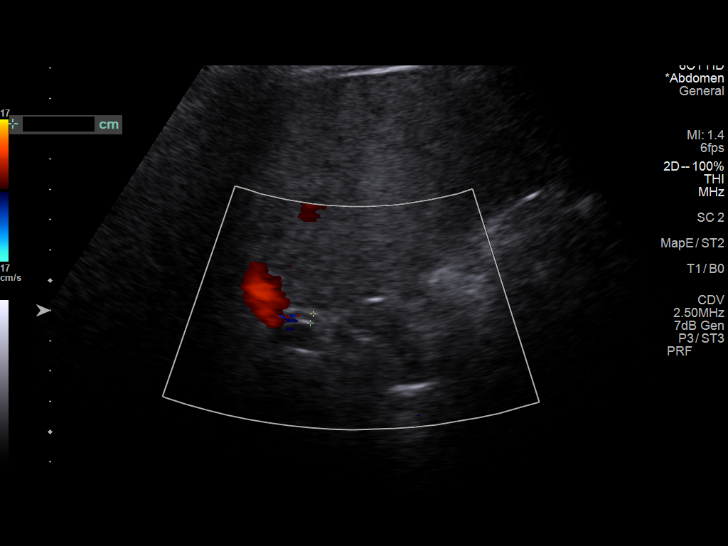
[im 7/37]
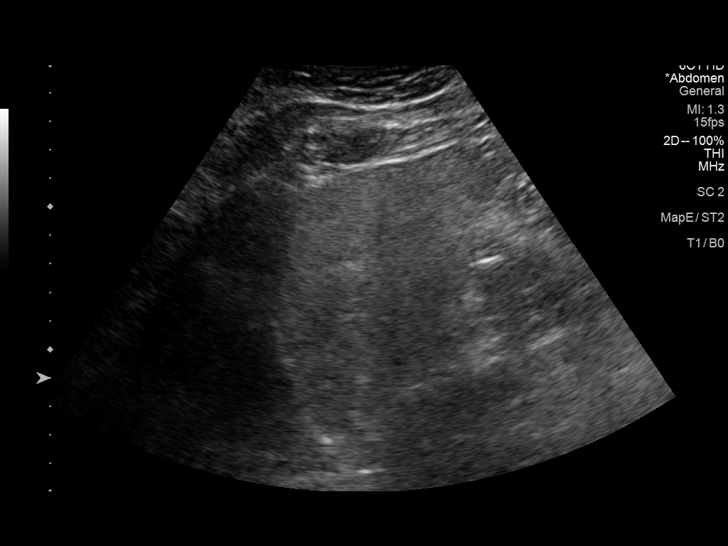
[im 10/37]
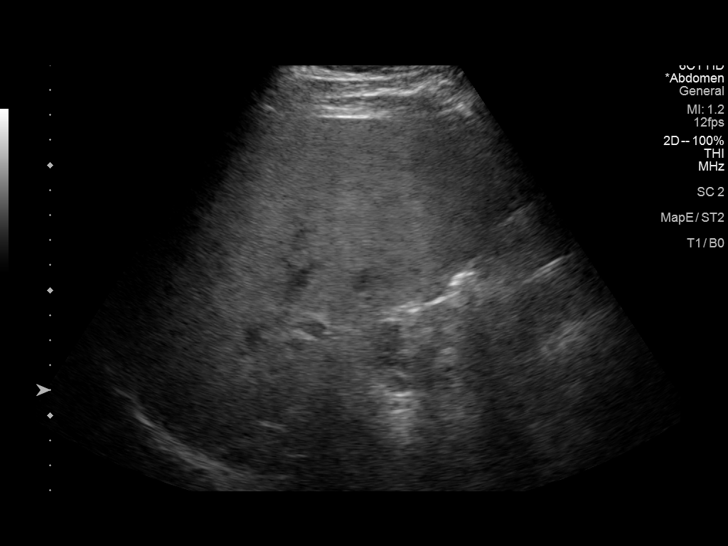
[im 13/37]
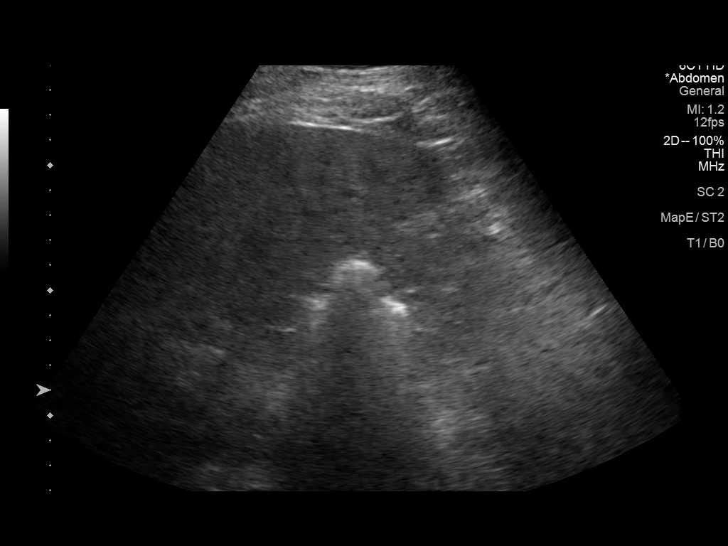
[im 14/37]
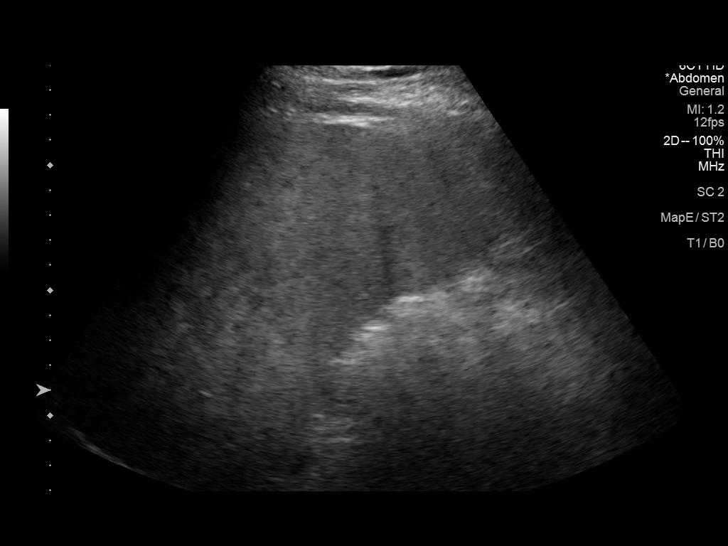
[im 17/37]
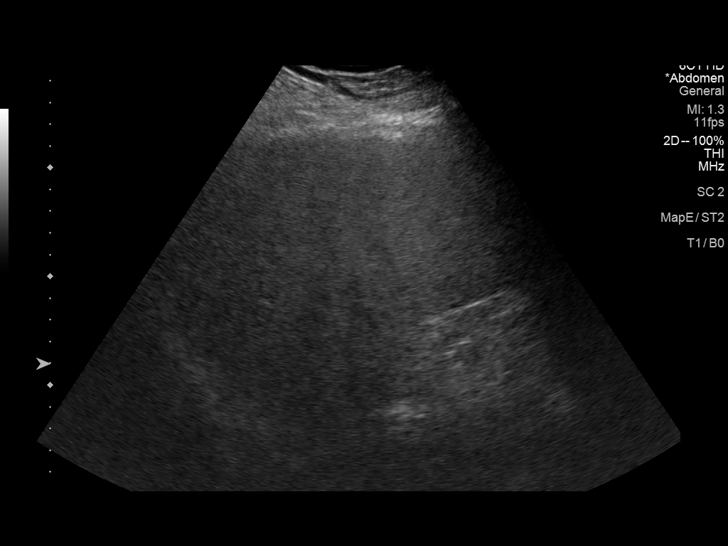
[im 20/37]
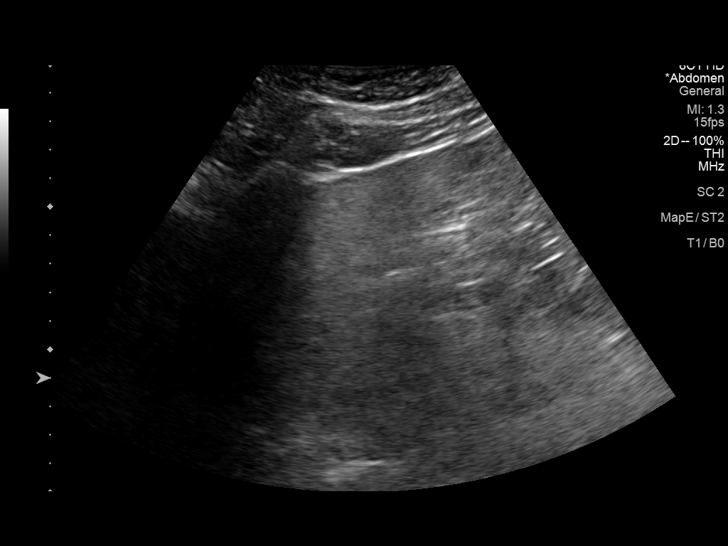
[im 23/37]
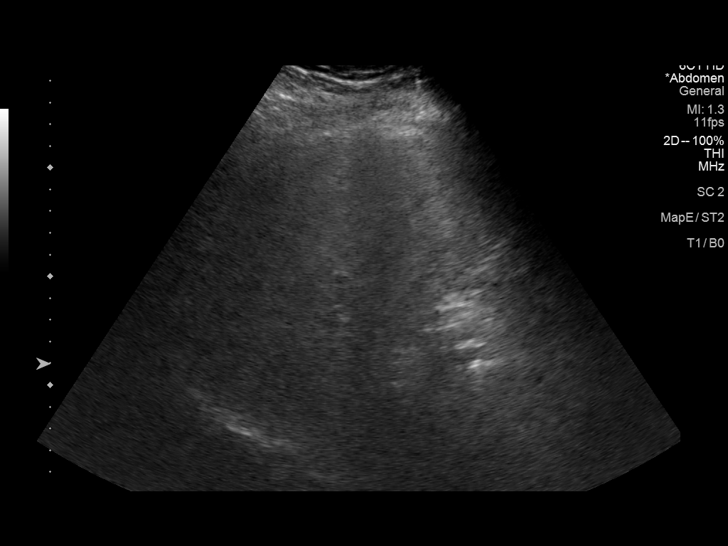
[im 25/37]
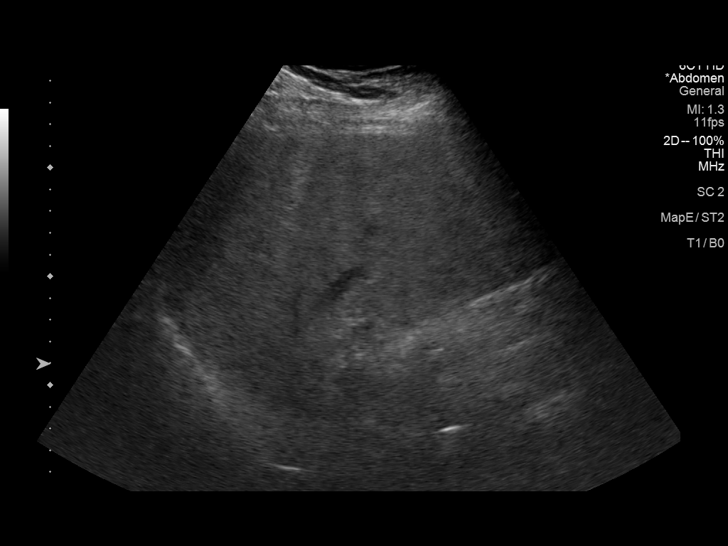
[im 28/37]
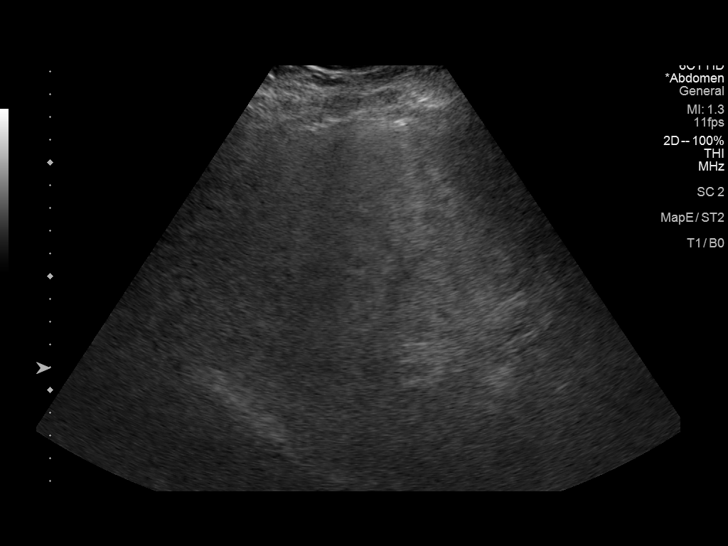
[im 31/37]
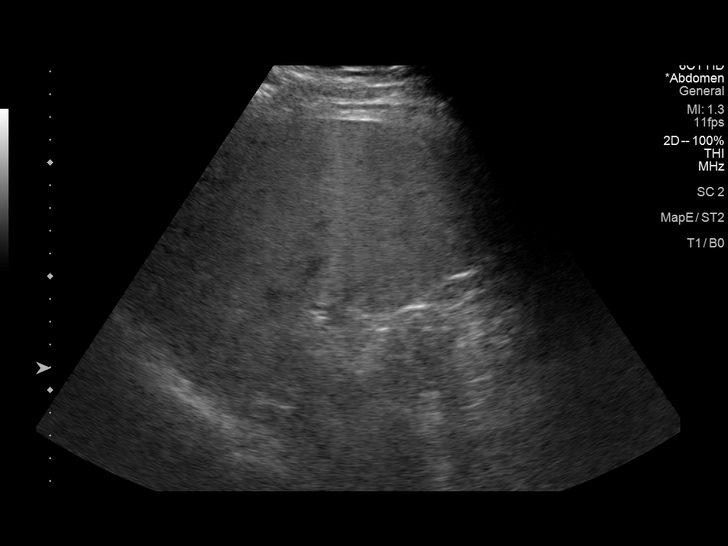
[im 34/37]
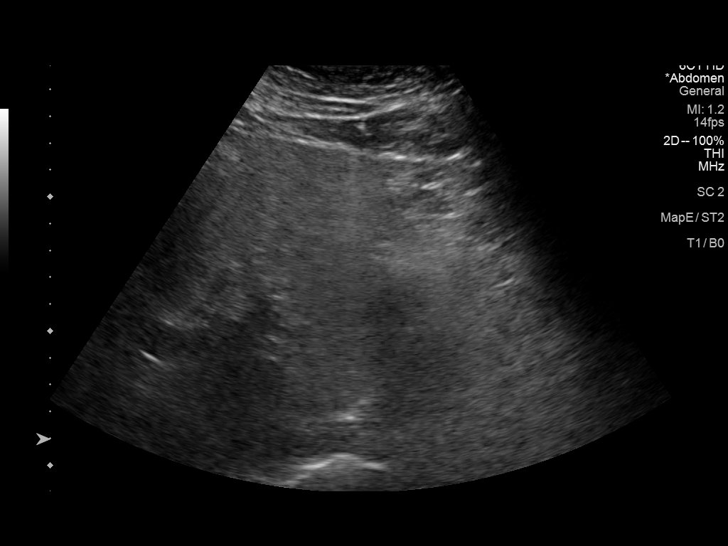
[im 37/37]
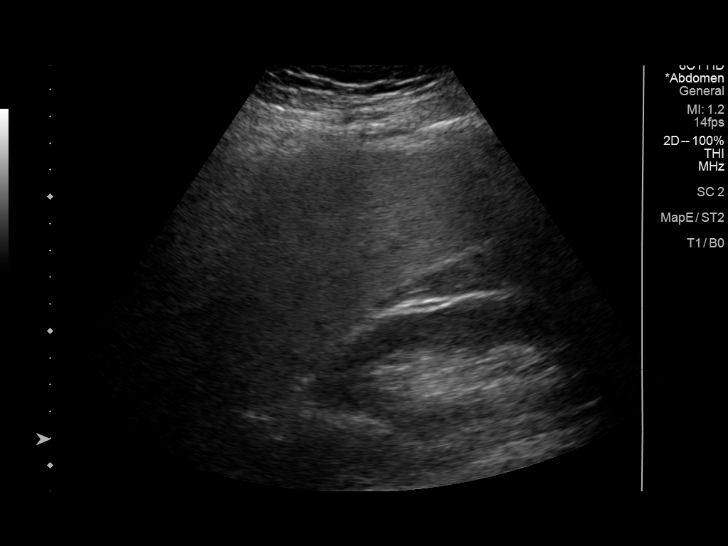

[14 of 25 positions shown; findings below may reference images not displayed]

FINDINGS: Gallbladder:

Prior cholecystectomy

Common bile duct:

Diameter: Normal caliber, 3 mm

Liver:

Increased echotexture compatible with fatty infiltration. No focal
abnormality or biliary ductal dilatation. Portal vein is patent on
color Doppler imaging with normal direction of blood flow towards
the liver.

Other: None.
IMPRESSION: Hepatic steatosis.  No acute findings or change.

## 2022-04-26 ENCOUNTER — Other Ambulatory Visit: Payer: Self-pay

## 2022-04-28 ENCOUNTER — Other Ambulatory Visit: Payer: Self-pay

## 2022-04-28 ENCOUNTER — Inpatient Hospital Stay: Payer: Commercial Managed Care - PPO | Attending: Hematology

## 2022-04-28 ENCOUNTER — Inpatient Hospital Stay: Payer: Commercial Managed Care - PPO

## 2022-04-28 ENCOUNTER — Inpatient Hospital Stay (HOSPITAL_BASED_OUTPATIENT_CLINIC_OR_DEPARTMENT_OTHER): Payer: Commercial Managed Care - PPO | Admitting: Hematology

## 2022-04-28 DIAGNOSIS — R945 Abnormal results of liver function studies: Secondary | ICD-10-CM

## 2022-04-28 LAB — CMP (CANCER CENTER ONLY)
ALT: 35 U/L (ref 0–44)
AST: 26 U/L (ref 15–41)
Albumin: 3.9 g/dL (ref 3.5–5.0)
Alkaline Phosphatase: 72 U/L (ref 38–126)
Anion gap: 6 (ref 5–15)
BUN: 16 mg/dL (ref 8–23)
CO2: 25 mmol/L (ref 22–32)
Calcium: 8.9 mg/dL (ref 8.9–10.3)
Chloride: 109 mmol/L (ref 98–111)
Creatinine: 0.94 mg/dL (ref 0.61–1.24)
GFR, Estimated: >60 mL/min (ref >60)
Glucose, Bld: 99 mg/dL (ref 70–99)
Potassium: 3.7 mmol/L (ref 3.5–5.1)
Sodium: 140 mmol/L (ref 135–145)
Total Bilirubin: 0.7 mg/dL (ref 0.3–1.2)
Total Protein: 7 g/dL (ref 6.5–8.1)

## 2022-04-28 LAB — CBC WITH DIFFERENTIAL (CANCER CENTER ONLY)
Abs Immature Granulocytes: 0.01 10*3/uL (ref 0.00–0.07)
Basophils Absolute: 0.1 10*3/uL (ref 0.0–0.1)
Basophils Relative: 1 %
Eosinophils Absolute: 0.2 10*3/uL (ref 0.0–0.5)
Eosinophils Relative: 4 %
HCT: 43.3 % (ref 39.0–52.0)
Hemoglobin: 15.3 g/dL (ref 13.0–17.0)
Immature Granulocytes: 0 %
Lymphocytes Relative: 43 %
Lymphs Abs: 2 10*3/uL (ref 0.7–4.0)
MCH: 30.6 pg (ref 26.0–34.0)
MCHC: 35.3 g/dL (ref 30.0–36.0)
MCV: 86.6 fL (ref 80.0–100.0)
Monocytes Absolute: 0.6 10*3/uL (ref 0.1–1.0)
Monocytes Relative: 12 %
Neutro Abs: 1.9 10*3/uL (ref 1.7–7.7)
Neutrophils Relative %: 40 %
Platelet Count: 233 10*3/uL (ref 150–400)
RBC: 5 MIL/uL (ref 4.22–5.81)
RDW: 13.5 % (ref 11.5–15.5)
WBC Count: 4.7 10*3/uL (ref 4.0–10.5)
nRBC: 0 % (ref 0.0–0.2)

## 2022-04-28 LAB — FERRITIN: Ferritin: 88 ng/mL (ref 24–336)

## 2022-04-28 LAB — IRON AND IRON BINDING CAPACITY (CC-WL,HP ONLY)
Iron: 64 ug/dL (ref 45–182)
Saturation Ratios: 19 % (ref 17.9–39.5)
TIBC: 344 ug/dL (ref 250–450)
UIBC: 280 ug/dL (ref 117–376)

## 2022-04-28 NOTE — Progress Notes (Signed)
HEMATOLOGY/ONCOLOGY CLINIC NOTE  Date of Service: 04/28/22  PCP: Dr Aura Dials MD   [place]:  Continued evaluation and management of hereditary hemochromatosis we see previous note for details on initial presentation   HISTORY OF PRESENTING ILLNESS:   Please see previous note for details on initial presentation  INTERVAL HISTORY  Bob Burton is here for follow-up of his hereditary hemochromatosis. Patient was last seen by me on 04/29/21 and was doing well overall with no new medical concerns.   Today, he reports that he recently had surgery in his right wrist for bone removal and metal insertion related to his arthritis issues. He has previously undergone this procedure twice before. He has recovered from this well.  He does note gout in his left foot. He experiences gout symptoms approximately once a year and takes Colchicine as needed. His symptoms resolve about 1-2 days after Colchicine, but he does not take any preventative medication.   He reports that his last phlebotomy was 3 weeks ago and has tolerated it well. He reports that his liver has been stable and regularly follows-up with his liver doctor. He also continues to follow up with his PCP annually.   He regularly stays active and denies any major new medications. He denies any abdominal pain and has not had any other new concerns over the last year.   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 Now uses an oral device. 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  #8 elevated liver function tests  #9 history of gout #10 gastroesophageal reflux  SURGICAL HISTORY:  #1 right wrist surgery #2 reports he is scheduled for carpal  tunnel syndrome surgery #3 Lasik 2007  SOCIAL HISTORY: Social History   Socioeconomic History   Marital status: Married    Spouse name: Not on file   Number of children: Not on file   Years of education: Not on file   Highest education level: Not on file  Occupational History   Not on file  Tobacco Use   Smoking status: Never   Smokeless tobacco: Never  Vaping Use   Vaping Use: Never used  Substance and Sexual Activity   Alcohol use: No   Drug use: No   Sexual activity: Not on file  Other Topics Concern   Not on file  Social History Narrative   Not on file   Social Determinants of Health   Financial Resource Strain: Not on file  Food Insecurity: Not on file  Transportation Needs: Not on file  Physical Activity: Not on file  Stress: Not on file  Social Connections: Not on file  Intimate Partner Violence: 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  FAMILY HISTORY: Patient is originally from [place]  Father deceased-from suicide Mother 17 year old  ALLERGIES:  has No Known Allergies.  MEDICATIONS:  Current Outpatient Medications  Medication Sig Dispense Refill   acetaminophen (TYLENOL) 325 MG tablet Take 2 tablets (650 mg total) by mouth every 6 (six) hours as  needed for fever (fever >/= 101). OVER THE COUNTER 30 tablet 0   albuterol (VENTOLIN HFA) 108 (90 Base) MCG/ACT inhaler Inhale 2 puffs into the lungs every 6 (six) hours as needed for wheezing or shortness of breath. 16 g 3   chlorpheniramine-HYDROcodone (TUSSIONEX) 10-8 MG/5ML SUER Take 5 mLs by mouth every 12 (twelve) hours as needed for cough. 115 mL 0   cholecalciferol (VITAMIN D) 25 MCG tablet Take 1 tablet (1,000  Units total) by mouth daily. 30 tablet 3   CYANOCOBALAMIN PO Take 1 tablet by mouth daily.     levothyroxine (SYNTHROID, LEVOTHROID) 150 MCG tablet Take 150 mcg by mouth daily before breakfast.      lisinopril-hydrochlorothiazide (PRINZIDE,ZESTORETIC) 10-12.5 MG tablet Take 1 tablet by mouth every morning.   0   Multiple Vitamin (MULTIVITAMIN WITH MINERALS) TABS tablet Take 1 tablet by mouth daily. 30 tablet 3   predniSONE (DELTASONE) 10 MG tablet Prednisone dosing: Take  Prednisone '40mg'$  (4 tabs) x 3 days, then taper to '30mg'$  (3 tabs) x 3 days, then '20mg'$  (2 tabs) x 3days, then '10mg'$  (1 tab) x 3days, then OFF. 30 tablet 0   vitamin C (VITAMIN C) 1000 MG tablet Take 1 tablet (1,000 mg total) by mouth daily. 30 tablet 3   zinc sulfate 220 (50 Zn) MG capsule Take 1 capsule (220 mg total) by mouth daily. 30 capsule 3   No current facility-administered medications for this visit.    REVIEW OF SYSTEMS:    10 Point review of Systems was done is negative except as noted above.  PHYSICAL EXAMINATION: ECOG PERFORMANCE STATUS: 0 - Asymptomatic  There were no vitals filed for this visit.  There were no vitals filed for this visit.  .There is no height or weight on file to calculate BMI.    GENERAL:alert, in no acute distress and comfortable SKIN: no acute rashes, no significant lesions EYES: conjunctiva are pink and non-injected, sclera anicteric OROPHARYNX: MMM, no exudates, no oropharyngeal erythema or ulceration NECK: supple, no JVD LYMPH:  no palpable lymphadenopathy in the cervical, axillary or inguinal regions LUNGS: clear to auscultation b/l with normal respiratory effort HEART: regular rate & rhythm ABDOMEN:  normoactive bowel sounds , non tender, not distended. Extremity: no pedal edema PSYCH: alert & oriented x 3 with fluent speech NEURO: no focal motor/sensory deficits   LABORATORY DATA:  I have reviewed the data as listed  .    Latest Ref Rng & Units 10/28/2021   10:33 AM  04/29/2021    8:03 AM 10/28/2020   12:16 PM  CBC  WBC 4.0 - 10.5 K/uL 6.6  5.8  5.9   Hemoglobin 13.0 - 17.0 g/dL 16.7  17.2  16.6   Hematocrit 39.0 - 52.0 % 46.1  47.3  45.7   Platelets 150 - 400 K/uL 146  155  165    .    Latest Ref Rng & Units 10/28/2021   10:33 AM 04/29/2021    8:03 AM 10/28/2020   12:16 PM  CMP  Glucose 70 - 99 mg/dL 185  129  117   BUN 8 - 23 mg/dL '20  18  18   '$ Creatinine 0.61 - 1.24 mg/dL 1.05  1.13  1.07   Sodium 135 - 145 mmol/L 139  137  138   Potassium 3.5 - 5.1 mmol/L 3.8  3.6  3.7   Chloride 98 - 111 mmol/L 106  105  109   CO2 22 - 32 mmol/L 28  24  21   Calcium 8.9 - 10.3 mg/dL 9.8  9.6  9.4   Total Protein 6.5 - 8.1 g/dL 7.1  7.1  7.0   Total Bilirubin 0.3 - 1.2 mg/dL 0.8  0.9  0.6   Alkaline Phos 38 - 126 U/L 83  64  74   AST 15 - 41 U/L 27  36  48   ALT 0 - 44 U/L 43  55  72    . Lab Results  Component Value Date   IRON 54 10/28/2021   TIBC 342 10/28/2021   IRONPCTSAT 16 (L) 10/28/2021   (Iron and TIBC)  Lab Results  Component Value Date   FERRITIN 139 10/28/2021           RADIOGRAPHIC STUDIES: I have personally reviewed the radiological images as listed and agreed with the findings in the report. No results found.  ASSESSMENT & PLAN:   68 y.o. 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 Patient's ferritin at baseline appears to be around 700's. 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 Most patients with isolated H63D carrier state did not develop overt clinical presentations of hemochromatosis. Patient does not report any overt joint issues. Has had no cardiac issues or cardiac arrhythmias. Has had elevated liver function tests attributable both to steatohepatitis.  #2 Elevated liver function tests Based on liver biopsy is predominantly from steatohepatitis with stage I fibrosis. No overt evidence of iron  overload. Patient has a liver specialist that he is seen by Roosevelt Locks NP)   PLAN:  -Discussed lab results on 04/28/22 with patient. CBC normal, showed WBC of 4.7K, hemoglobin of 15.3, and platelets of 233K. -CMP and iron levels pending -As Iron is stable, advised patient to follow-up with his PCP to check iron levels on an annual basis -Recommend patient to follow-up with his liver doctor every 6 months to monitor liver as there was a  small sign of fibrosis present.  -Recommend therapeutic phlebotomies at least twice a year, maybe 3-4 times a year if patient prefers.  FOLLOW-UP: Please cancel all current oncology/hematology appointments including therapeutic Phlebotomies. Patient with donate blood with red cross and f/u with PCP for lab monitoring.  The total time spent in the appointment was *** minutes* .  All of the patient's questions were answered with apparent satisfaction. The patient knows to call the clinic with any problems, questions or concerns.   Sullivan Lone MD MS AAHIVMS [facility] Hematology/Oncology Physician [facility]  .*Total Encounter Time as defined by the Centers for Medicare and Medicaid Services includes, in addition to the face-to-face time of a patient visit (documented in the note above) non-face-to-face time: obtaining and reviewing outside history, ordering and reviewing medications, tests or procedures, care coordination (communications with other health care professionals or caregivers) and documentation in the medical record.   I,Mitra Faeizi,acting as a Education administrator for Sullivan Lone, MD.,have documented all relevant documentation on the behalf of Sullivan Lone, MD,as directed by  Sullivan Lone, MD while in the presence of Sullivan Lone, MD.  ***

## 2022-05-04 ENCOUNTER — Encounter: Payer: Self-pay | Admitting: Hematology

## 2022-05-16 ENCOUNTER — Encounter: Payer: Self-pay | Admitting: Hematology

## 2022-11-22 ENCOUNTER — Other Ambulatory Visit: Payer: Self-pay | Admitting: Nurse Practitioner

## 2022-11-22 DIAGNOSIS — K7581 Nonalcoholic steatohepatitis (NASH): Secondary | ICD-10-CM

## 2022-11-22 DIAGNOSIS — K7401 Hepatic fibrosis, early fibrosis: Secondary | ICD-10-CM

## 2022-11-28 ENCOUNTER — Ambulatory Visit
Admission: RE | Admit: 2022-11-28 | Discharge: 2022-11-28 | Disposition: A | Discharge status: Home / Self Care | Payer: Commercial Managed Care - PPO | Source: Ambulatory Visit | Attending: Nurse Practitioner | Admitting: Nurse Practitioner

## 2022-11-28 DIAGNOSIS — K7401 Hepatic fibrosis, early fibrosis: Secondary | ICD-10-CM

## 2022-11-28 DIAGNOSIS — K7581 Nonalcoholic steatohepatitis (NASH): Secondary | ICD-10-CM

## 2024-02-07 ENCOUNTER — Other Ambulatory Visit: Payer: Self-pay | Admitting: Nurse Practitioner

## 2024-02-07 DIAGNOSIS — K7581 Nonalcoholic steatohepatitis (NASH): Secondary | ICD-10-CM

## 2024-02-19 ENCOUNTER — Inpatient Hospital Stay: Admission: RE | Admit: 2024-02-19 | Discharge: 2024-02-19 | Attending: Nurse Practitioner

## 2024-02-19 DIAGNOSIS — K7581 Nonalcoholic steatohepatitis (NASH): Secondary | ICD-10-CM
